# Patient Record
Sex: Female | Born: 1935 | Race: White | Hispanic: No | Smoking: Never smoker
Health system: Southern US, Community
[De-identification: ages and names within clinical notes are randomized; demographics above are authoritative.]

## PROBLEM LIST (undated history)

## (undated) DIAGNOSIS — M069 Rheumatoid arthritis, unspecified: Secondary | ICD-10-CM

## (undated) DIAGNOSIS — I499 Cardiac arrhythmia, unspecified: Secondary | ICD-10-CM

## (undated) DIAGNOSIS — M810 Age-related osteoporosis without current pathological fracture: Secondary | ICD-10-CM

## (undated) HISTORY — PX: OTHER SURGICAL HISTORY: SHX169

## (undated) HISTORY — PX: APPENDECTOMY: SHX54

## (undated) HISTORY — PX: ABDOMINAL HYSTERECTOMY: SHX81

---

## 1998-05-15 ENCOUNTER — Ambulatory Visit: Admission: RE | Admit: 1998-05-15 | Discharge: 1998-05-15 | Payer: Self-pay | Admitting: Endocrinology

## 1998-05-22 ENCOUNTER — Other Ambulatory Visit: Admission: RE | Admit: 1998-05-22 | Discharge: 1998-05-22 | Payer: Self-pay | Admitting: Endocrinology

## 1999-05-30 ENCOUNTER — Other Ambulatory Visit: Admission: RE | Admit: 1999-05-30 | Discharge: 1999-05-30 | Payer: Self-pay | Admitting: Endocrinology

## 1999-07-16 ENCOUNTER — Ambulatory Visit (HOSPITAL_COMMUNITY): Admission: RE | Admit: 1999-07-16 | Discharge: 1999-07-16 | Payer: Self-pay | Admitting: Endocrinology

## 1999-07-16 ENCOUNTER — Encounter: Payer: Self-pay | Admitting: Endocrinology

## 2000-02-19 ENCOUNTER — Ambulatory Visit (HOSPITAL_COMMUNITY): Admission: RE | Admit: 2000-02-19 | Discharge: 2000-02-19 | Payer: Self-pay | Admitting: Endocrinology

## 2000-02-19 ENCOUNTER — Encounter: Payer: Self-pay | Admitting: Endocrinology

## 2000-06-08 ENCOUNTER — Other Ambulatory Visit: Admission: RE | Admit: 2000-06-08 | Discharge: 2000-06-08 | Payer: Self-pay | Admitting: Endocrinology

## 2000-07-20 ENCOUNTER — Ambulatory Visit (HOSPITAL_COMMUNITY): Admission: RE | Admit: 2000-07-20 | Discharge: 2000-07-20 | Payer: Self-pay | Admitting: Endocrinology

## 2000-07-20 ENCOUNTER — Encounter: Payer: Self-pay | Admitting: Endocrinology

## 2001-06-15 ENCOUNTER — Other Ambulatory Visit: Admission: RE | Admit: 2001-06-15 | Discharge: 2001-06-15 | Payer: Self-pay | Admitting: Endocrinology

## 2001-07-22 ENCOUNTER — Encounter: Payer: Self-pay | Admitting: Endocrinology

## 2001-07-22 ENCOUNTER — Ambulatory Visit (HOSPITAL_COMMUNITY): Admission: RE | Admit: 2001-07-22 | Discharge: 2001-07-22 | Payer: Self-pay | Admitting: Endocrinology

## 2002-07-25 ENCOUNTER — Encounter: Payer: Self-pay | Admitting: Endocrinology

## 2002-07-25 ENCOUNTER — Ambulatory Visit (HOSPITAL_COMMUNITY): Admission: RE | Admit: 2002-07-25 | Discharge: 2002-07-25 | Payer: Self-pay | Admitting: Endocrinology

## 2003-05-10 ENCOUNTER — Ambulatory Visit: Admission: RE | Admit: 2003-05-10 | Discharge: 2003-05-10 | Payer: Self-pay | Admitting: Internal Medicine

## 2003-05-10 ENCOUNTER — Encounter (INDEPENDENT_AMBULATORY_CARE_PROVIDER_SITE_OTHER): Payer: Self-pay | Admitting: *Deleted

## 2004-07-26 ENCOUNTER — Encounter (INDEPENDENT_AMBULATORY_CARE_PROVIDER_SITE_OTHER): Payer: Self-pay | Admitting: *Deleted

## 2004-07-26 ENCOUNTER — Ambulatory Visit (HOSPITAL_BASED_OUTPATIENT_CLINIC_OR_DEPARTMENT_OTHER): Admission: RE | Admit: 2004-07-26 | Discharge: 2004-07-26 | Payer: Self-pay | Admitting: General Surgery

## 2004-07-26 ENCOUNTER — Ambulatory Visit (HOSPITAL_COMMUNITY): Admission: RE | Admit: 2004-07-26 | Discharge: 2004-07-26 | Payer: Self-pay | Admitting: General Surgery

## 2006-05-21 ENCOUNTER — Inpatient Hospital Stay (HOSPITAL_COMMUNITY): Admission: EM | Admit: 2006-05-21 | Discharge: 2006-05-23 | Payer: Self-pay | Admitting: Emergency Medicine

## 2006-10-06 ENCOUNTER — Ambulatory Visit (HOSPITAL_COMMUNITY): Admission: RE | Admit: 2006-10-06 | Discharge: 2006-10-06 | Payer: Self-pay | Admitting: *Deleted

## 2007-09-01 ENCOUNTER — Encounter: Admission: RE | Admit: 2007-09-01 | Discharge: 2007-09-01 | Payer: Self-pay | Admitting: Endocrinology

## 2008-10-03 ENCOUNTER — Encounter: Admission: RE | Admit: 2008-10-03 | Discharge: 2008-10-03 | Payer: Self-pay

## 2011-05-09 NOTE — Op Note (Signed)
[name], Mackenzie Flores                    ACCOUNT NO.:  [id number]   MEDICAL RECORD NO.:  [id number]          PATIENT TYPE:  AMB   LOCATION:  ENDO                         FACILITY:  [facility]   PHYSICIAN:  Georgiana Spinner, M.D.    DATE OF BIRTH:  05/29/1927   DATE OF PROCEDURE:  10/06/2006  DATE OF DISCHARGE:                                 OPERATIVE REPORT   PROCEDURE:  Colonoscopy   INDICATIONS:  Colon screening.   ANESTHESIA:  Fentanyl 75 mcg, Versed 7.5 mg.   PROCEDURE:  With the patient mildly sedated in the left lateral decubitus  position, the Olympus videoscopic colonoscope was inserted into the rectum,  passed under direct vision to the cecum identified by ileocecal valve and  appendiceal orifice both of which were photographed.  From this point  colonoscope was slowly withdrawn taking circumferential views of colonic  mucosa stopping only in the rectum which appeared normal on direct and  showed hemorrhoids on retroflexed view.  The endoscope was straightened and  withdrawn.  The patient's vital signs, pulse oximeter remained stable.  The  patient tolerated procedure well without apparent complications.   FINDINGS:  Internal hemorrhoids, otherwise unremarkable examination.           ______________________________  Georgiana Spinner, M.D.     GMO/MEDQ  D:  10/06/2006  T:  10/07/2006  Job:  [id number]

## 2011-05-09 NOTE — H&P (Signed)
Mackenzie Flores, Mackenzie Flores                    ACCOUNT NO.:  [id number]   MEDICAL RECORD NO.:  [id number]          PATIENT TYPE:  INP   LOCATION:  0102                         FACILITY:  [facility]   PHYSICIAN:  Elliot Cousin, M.D.    DATE OF BIRTH:  03-25-1927   DATE OF ADMISSION:  05/21/2006  DATE OF DISCHARGE:                                HISTORY & PHYSICAL   PRIMARY CARE PHYSICIAN:  Brooke Bonito, M.D.   CHIEF COMPLAINT:  Left arm pain and chest tightness.   HISTORY OF PRESENT ILLNESS:  The patient is a 77 year old lady with a past  medical history significant for temporal arteritis, diagnosed in August of  2005,  chronic steroid therapy, history of carotid narrowing, and an  irregular heartbeat, who presented to the emergency department with a chief  complaint of left arm pain that started three to four days ago. It is  described as achy.  The intensity of the pain is rated as moderate.  It is  located primarily in the biceps area and it sometimes radiates down to her  left hand.  It has been intermittent.  The pain is not associated with  numbness or weakness.  She denies any heavy lifting or trauma.  She denies  any increase in activity of the left arm.  She is right-handed.  When she  arrived to the emergency department, she had an episode of chest tightness  which was rated as a 3/10 in intensity.  She was given two sublingual  nitroglycerin tablets and the chest tightness became a 1/10 in intensity.  The chest pain is not associated with nausea, diaphoresis or  lightheadedness.  It is located substernally.  No other associated symptoms.  In the patient's review of systems, she has occasional swelling in her legs,  occasional right jaw pain and a right-sided headache.  She has had no  unilateral leg swelling, fever or chills, shortness of breath, difficulty  swallowing, reflux, nausea, vomiting, or diarrhea, and no recent history of  dysuria.   During the evaluation in the emergency  department, the patient is noted to  be afebrile and hemodynamically stable.  Her initial cardiac markers are  negative.  Her sodium is low at 124 and her potassium is low at 3.1.  Her  EKG reveals an incomplete right bundle branch block and PVCs (question  trigeminy).  Given the patient's risk factors, she will be admitted for  further evaluation and management.   MEDICATIONS:  1.  Aspirin 81 mg daily.  2.  Potassium chloride 20 mEq daily.  3.  Medrol 2 mg every other day.  4.  Pepcid (over-the-counter) 10 mg every other day.  5.  Propranolol 20 mg b.i.d.  6.  Zaroxolyn 5 mg daily.  7.  Belladonna alkaloids question dose b.i.d. for nervous stomach.   ALLERGIES:  The patient has an allergy to CODEINE.   PAST MEDICAL HISTORY:  1.  Right temporal arteritis, diagnosed in August of 2005.  The patient is      treated with chronic steroids.  2.  Irregular  heartbeat.  The patient takes propranolol chronically.  3.  Benign lung mass per bronchoscopy in May of 2004.  4.  History of carotid narrowing.  5.  Chronic bilateral lower extremity edema.  6.  Nervous stomach.  7.  Status post vaginal hysterectomy in the 1970s.  8.  Status post appendectomy in the 1950s.   SOCIAL HISTORY:  The patient is widowed.  [place]  She has one daughter who lives with her.  She has two children in  all.  She is retired from working in [employer].  She quit smoking in 1974.  She denies alcohol and illicit drug use.   FAMILY HISTORY:  Her mother died at 80 years of age secondary to heart  failure.  Her father died of a stroke at 32 years of age.   REVIEW OF SYSTEMS:  As above in the history of present illness.   PHYSICAL EXAMINATION:  GENERAL APPEARANCE:  The patient is a pleasant 77-  year-old Caucasian lady who is currently sitting up in bed in no acute  distress.  VITAL SIGNS:  Temperature afebrile.  Blood pressure 126/50, pulse 65,  respiratory rate 16, oxygen  saturation 97% on room air.  HEENT:  Head is normocephalic and atraumatic.  The right temporal artery is  not palpable.  The left temporal artery is palpable.  Pupils are equal,  round and reactive to light.  Extraocular movements are intact.  Conjunctivae are clear.  Sclerae are white.  Tympanic membranes are clear  bilaterally.  Nasal mucosa is mildly dry.  No sinus tenderness.  Oropharynx  reveals a set of dentures.  Mucous membranes are mildly dry.  No posterior  exudates or erythema.  NECK:  I could not detect carotid bruits.  Her neck is supple.  No  adenopathy, no thyromegaly.  No JVD.  LUNGS:  Clear to auscultation bilaterally.  CHEST WALL:  The patient has only minimal tenderness over the substernal  area.  No associated rash or erythema.  CARDIOVASCULAR:  S1 and S2 with an ectopic beat and 1 to 2/6 systolic  murmur.  BACK:  The patient has mild to moderate thoracic kyphosis.  No tenderness  over the lumbar or thoracic spine.  ABDOMEN:  Positive bowel sounds, soft, nontender and nondistended.  No  hepatosplenomegaly.  No masses palpated.  EXTREMITIES:  The patient has mild arthritic changes in her hands and her  knees bilaterally.  Pedal pulses are 2+ bilaterally.  She has multiple  varicosities of her lower extremities bilaterally.  Trace of pedal edema  bilaterally.  Range of motion of the right and left arm are within normal  limits.  She has mild tenderness over the left rotator cuff and biceps and  triceps areas without any associated edema or erythema.  Hand grip  bilaterally is within normal limits.  Brachial pulses are within normal  limits bilaterally.  Radial pulses are intact bilaterally.  NEUROLOGIC:  The patient is alert and oriented x3.  Cranial nerves II-XII  are intact.  Strength is 5/5 throughout.  Sensation is intact.   ADMISSION LABORATORY DATA:  EKG reveals normal sinus rhythm with PVCs, incomplete right bundle branch block, heart rate 75 beats per  minute.   Myoglobin 107, CK-MB 4, troponin I less than 0.05.  Sodium 124, potassium  3.1, chloride 89, CO2 25, glucose 98, BUN 5, creatinine 0.5, calcium 8.2.   ASSESSMENT:  1.  Left proximal arm pain and substernal chest pain.  The patient's primary      complaint was left arm pain, however, when she arrived to the emergency      department, she had an episode of substernal chest tightness. Her      presentation is somewhat atypical, although, she does have cardiac risk      factors including peripheral vascular disease, age and a cardiac      arrhythmia.  Will also consider musculoskeletal pain,  gastroesophageal      reflux disease,  and anxiety as potential etiologies.   1.  Hyponatremia.  The patient's serum sodium on admission is 124.  The      etiology is unclear.  However, we will consider hyovolemia as the cause,      as she takes Zaroxolyn chronically.  If the patient's serum sodium does      not improve with normal saline, additional sodium indices will need to      be assessed.   1.  Hypokalemia.  The patient's serum potassium 3.1.  More than likely, the      hypokalemia is secondary to [facility] therapy.  Will need to rule out      magnesium deficiency.   1.  Premature ventricular contractions, question trigeminy.  The patient      gives a history of an irregular heartbeat for which she takes      propranolol.  There does not appear to be any evidence of atrial      fibrillation.  Her heart rate is within normal limits.  As stated above,      magnesium deficiency will need to be ruled out.   1.  History of temporal arteritis, diagnosed in August of 2005.  The patient      is chronically treated with steroids.  However, under the circumstances,      the dose will need to be titrated up marginally to cover for stress.   PLAN:  1.  The patient will be admitted for further evaluation and management.  2.  Cardiac enzymes q.8h. x3 will be ordered, to rule out myocardial       infarction. B natriuretic peptide, TSH, fasting lipid panel and      homocystine level will also be assessed.  3.  Will check a chest x-ray PA and lateral to rule out abnormalities.  4.  Will check a magnesium level to rule out magnesium deficiency. Will also      check a baseline CBC and PT/PTT.  5.  Will repeat an EKG in the a.m.  6.  Will consult cardiologist, Dr. Aleen Campi.  7.  Will order bilateral carotid Dopplers to evaluate the extent of carotid      artery stenosis.  8.  Will treat the patient's pain with as needed morphine.  Will also add      nitroglycerin paste one quarter of an inch q.6h. Will start full dose      Lovenox 1 mg/kg subcu q.12h. until the patient completely rules out for      a myocardial infarction.  Will continue an aspirin 81 mg daily.  Will      continue beta-blocker therapy with Lopressor instead of propranolol for      now. 9.  Will replete potassium chloride in the IV fluids and orally.  Will start      volume repletion with normal saline at 100 mL an hour.  10. Will check additional sodium indices if needed.  The patient's serum  sodium and potassium will be reassessed in the morning.  11. Will increase the Medrol to 4 mg daily x2 days and then titrate back      down to 2 mg every other day over the next few days.      Elliot Cousin, M.D.  Electronically Signed     DF/MEDQ  D:  05/21/2006  T:  05/21/2006  Job:  [id number]   cc:   Brooke Bonito, M.D.  Fax: [phone]

## 2011-05-09 NOTE — Op Note (Signed)
NAME:  Mackenzie Flores, Mackenzie Flores                              ACCOUNT NO.:  [id number]   MEDICAL RECORD NO.:  [id number]                   PATIENT TYPE:  AMB   LOCATION:  CARD                                 FACILITY:  [facility]   PHYSICIAN:  Casimiro Needle B. Sherene Sires, M.D. [facility]           DATE OF BIRTH:  [birth year]   DATE OF PROCEDURE:  05/10/2003  DATE OF DISCHARGE:                                 OPERATIVE REPORT   PROCEDURE:  Fiberoptic bronchoscopy with transbronchial biopsy of the right  lower lobe.   INDICATIONS FOR PROCEDURE:  This is a 77 year old white female with recent  onset hemoptysis associated with a dense possible cavitary mass in the right  lower lobe peripherally. Bronchoscopy was felt to be indicated for a tissue  diagnosis and to explore the endobronchial tree to be sure there is not  another source of hemoptysis. For a full discussion please see office notes  for a comprehensive pulmonary consultation which includes a comprehensive  pulmonary consultation performed within the last week. Since that evaluation  was completed there has been no change on history or exam although the  hemoptysis has lessened. The hemoptysis has apparently resolved.   The procedure was performed in the bronchoscopy suite with continuous  monitoring by surface ECG and oximetry. The patient maintained adequate  saturations throughout the procedure on sinus rhythm on nasal prongs. She  received a total of 25 mg of IV Demerol and 2.5 mg of IV Versed for adequate  sedation and cough suppression.   The right naris and oropharynx were liberally anesthetized with 1% lidocaine  by updraft Nebulizer. The right naris was additionally prepared with 2%  lidocaine jelly.   Using a standard flexible fiberoptic bronchoscope, the right naris was  easily cannulated with good visualization of the entire oropharynx and  larynx. The cords moved normally and there were no apparent upper airway  lesions.   Using an additional 1%  lidocaine as needed, the entire tracheobronchial tree  was explored bilaterally with the following findings:   The trachea, carina and all the major airways opened widely on inspiration  to the subsegmental level. There were no focal endobronchial lesions.   DESCRIPTION OF PROCEDURE:  Using a wedge position in several of the anterior  and lateral basal segments of the right lower lobe, the area could be  reached, at least at its inferior aspect, with adequate tissue x4 obtained.  There was minimal bleeding.   I additionally lavaged the right lower lobe for AFB, fungal stain and  culture as well as cytology.    IMPRESSION:  1. Right lower lobe mass undetermined etiology, await the above studies.  2. No evidence of endobronchial source of hemoptysis, therefore, the     hemoptysis most likely is from the mass peripherally.  Charlaine Dalton. Sherene Sires, M.D. [facility]    MBW/MEDQ  D:  05/10/2003  T:  05/10/2003  Job:  [id number]   cc:   Brooke Bonito, M.D.  [address]  [place]  [place] [address]  Fax: [phone]

## 2011-05-09 NOTE — Discharge Summary (Signed)
Mackenzie Flores, Mackenzie Flores                    ACCOUNT NO.:  [id number]   MEDICAL RECORD NO.:  [id number]          PATIENT TYPE:  INP   LOCATION:  1405                         FACILITY:  [facility]   PHYSICIAN:  Michaelyn Barter, M.D. DATE OF BIRTH:  Sep 18, 1927   DATE OF ADMISSION:  05/21/2006  DATE OF DISCHARGE:  05/23/2006                                 DISCHARGE SUMMARY   FINAL DIAGNOSES:  1.  Atypical chest pain.  2.  Left arm pain.  3.  Hyponatremia.  4.  Hypokalemia.  5.  Carotid artery stenosis.   CONSULTATIONS:  Cardiology with Dr. Aleen Campi.   PROCEDURE:  Chest x-ray completed May 21, 2006.   HISTORY OF PRESENT ILLNESS:  Mackenzie Flores is a 77 year old female who presented  to the ER with chief complaint of left arm pain that started 3-4 days prior  to this visit.  The pain was located primarily in her biceps with occasional  radiation down to her left hand.  It is intermittent.  When she arrived to  the ER she also complained of chest tightness rated as 3/10 in intensity.   PAST MEDICAL HISTORY:  Please see that dictated by Dr. Elliot Cousin on May 21, 2006.   HOSPITAL COURSE:  1.  Chest tightness.  The patient had a chest x-ray completed May 31.  The      final impression was no active disease.  Hiatal hernia.  Cardiology was      consulted.  Dr. Aleen Campi was the physician who responded to the      consultation.  He agreed that an adenosine stress test may be indicated      in this patient.  Cardiac enzymes were drawn and her troponin-I was      found to be 0.01, 0.02 and 0.01 respectively.  Her CK-MBs were also      normal x3.  Therefore, she ruled out for an acute cardiac event.  By the      following day, the patient stated that she felt much better.  She denied      having chest pain and likewise stated that her left arm felt much      better.  Dr. Aleen Campi indicated that the patient was stable from a      cardiovascular standpoint, she ruled out for an MI and that she could  follow up with him in his office within 1 week to arrange for an      outpatient Persantine Cardiolite stress test.  By June 2, the patient's      chest pain had completely resolved.  Her arm pain had decreased      significantly.  2.  Left arm pain.  This was very atypical in its description.  Her symptoms      resolved quickly over the course of her hospitalization.  3.  Hyponatremia.  The patient's sodium level was 124 at the time of      admission.  Normal saline was provided to the patient and her sodium      level improved to a  sodium of 137 on June 1, and on the day of discharge      the patient's sodium level was 135.  4.  Hypokalemia.  The patient was noted at the time of her admission to have      had a potassium level of 3.1.  This was treated with supplementation      over the course of her hospitalization and by the date of discharge it      was 3.6.  5.  Carotid artery stenosis.  The patient had cardiovascular Dopplers      completed on May 22, 2006.  The right side showed no internal carotid      artery stenosis.  The left side showed 60% to 80% stenosis.   CONDITION AT THE TIME OF DISCHARGE:  Improved.  On date of discharge, the  patient indicated that her chest pain had resolved.  She denied having any  shortness of breath and also stated that her arm pain had decreased.  Her vitals on the date of discharge showed temperature 97.3, heart rate 66,  respirations 20, blood pressure 116/53.  The decision was made to discharge  the patient home.  She was told to call Dr. Aleen Campi on Monday, May 25, 2006, to schedule a Cardiolite stress test.      Michaelyn Barter, M.D.  Electronically Signed     OR/MEDQ  D:  06/14/2006  T:  06/14/2006  Job:  [id number]

## 2012-08-11 ENCOUNTER — Other Ambulatory Visit (HOSPITAL_COMMUNITY): Payer: Self-pay | Admitting: *Deleted

## 2012-08-12 ENCOUNTER — Ambulatory Visit (HOSPITAL_COMMUNITY)
Admission: RE | Admit: 2012-08-12 | Discharge: 2012-08-12 | Disposition: A | Discharge status: Home / Self Care | Payer: Medicare Other | Source: Ambulatory Visit | Attending: Rheumatology | Admitting: Rheumatology

## 2012-08-12 DIAGNOSIS — M810 Age-related osteoporosis without current pathological fracture: Secondary | ICD-10-CM | POA: Insufficient documentation

## 2012-08-12 MED ORDER — ZOLEDRONIC ACID 5 MG/100ML IV SOLN
5.0000 mg | Freq: Once | INTRAVENOUS | Status: AC
  Administered 2012-08-12: 5 mg via INTRAVENOUS
  Filled 2012-08-12: qty 100

## 2014-09-05 ENCOUNTER — Emergency Department (HOSPITAL_COMMUNITY): Payer: Medicare Other

## 2014-09-05 ENCOUNTER — Emergency Department (HOSPITAL_COMMUNITY)
Admission: EM | Admit: 2014-09-05 | Discharge: 2014-09-05 | Disposition: A | Discharge status: Home / Self Care | Payer: Medicare Other | Attending: Emergency Medicine | Admitting: Emergency Medicine

## 2014-09-05 ENCOUNTER — Encounter (HOSPITAL_COMMUNITY): Payer: Self-pay | Admitting: Emergency Medicine

## 2014-09-05 DIAGNOSIS — Z8679 Personal history of other diseases of the circulatory system: Secondary | ICD-10-CM | POA: Insufficient documentation

## 2014-09-05 DIAGNOSIS — Z8739 Personal history of other diseases of the musculoskeletal system and connective tissue: Secondary | ICD-10-CM | POA: Diagnosis not present

## 2014-09-05 DIAGNOSIS — S79929A Unspecified injury of unspecified thigh, initial encounter: Secondary | ICD-10-CM

## 2014-09-05 DIAGNOSIS — S79919A Unspecified injury of unspecified hip, initial encounter: Secondary | ICD-10-CM | POA: Diagnosis present

## 2014-09-05 DIAGNOSIS — Y9389 Activity, other specified: Secondary | ICD-10-CM | POA: Insufficient documentation

## 2014-09-05 DIAGNOSIS — S7000XA Contusion of unspecified hip, initial encounter: Secondary | ICD-10-CM | POA: Diagnosis not present

## 2014-09-05 DIAGNOSIS — S7002XA Contusion of left hip, initial encounter: Secondary | ICD-10-CM

## 2014-09-05 DIAGNOSIS — Y92009 Unspecified place in unspecified non-institutional (private) residence as the place of occurrence of the external cause: Secondary | ICD-10-CM | POA: Insufficient documentation

## 2014-09-05 DIAGNOSIS — R296 Repeated falls: Secondary | ICD-10-CM | POA: Insufficient documentation

## 2014-09-05 HISTORY — DX: Cardiac arrhythmia, unspecified: I49.9

## 2014-09-05 NOTE — Progress Notes (Signed)
  CARE MANAGEMENT ED NOTE 09/05/2014  Patient:  SARETTA, DAHLEM   Account Number:  [id number]  Date Initiated:  09/05/2014  Documentation initiated by:  Edd Arbour  Subjective/Objective Assessment:   80 yr old [employer] medicare complete Pt was at home carrying sheets when she fell. Pt c/o left hip and groin pain. Pt denies hitting her head or taking in anticoagulants, states that she takes a baby ASA.     Subjective/Objective Assessment Detail:   pcp Michiel Sites     Action/Plan:   SPoke with pt and daughter at bedside updated EPIC   Action/Plan Detail:   Anticipated DC Date:       Status Recommendation to Physician:   Result of Recommendation:    Other ED Services  Consult Working Plan    DC Planning Services  Other  PCP issues  Outpatient Services - Pt will follow up    Choice offered to / List presented to:            Status of service:  Completed, signed off  ED Comments:   ED Comments Detail:

## 2014-09-05 NOTE — ED Notes (Signed)
Pt was at home carrying sheets when she fell. Pt c/o left hip and groin pain. Pt denies hitting her head or taking in anticoagulants, states that she takes a baby ASA.

## 2014-09-05 NOTE — Discharge Instructions (Signed)
Hip Pain Your hip is the joint between your upper legs and your lower pelvis. The bones, cartilage, tendons, and muscles of your hip joint perform a lot of work each day supporting your body weight and allowing you to move around. Hip pain can range from a minor ache to severe pain in one or both of your hips. Pain may be felt on the inside of the hip joint near the groin, or the outside near the buttocks and upper thigh. You may have swelling or stiffness as well.  HOME CARE INSTRUCTIONS   Take medicines only as directed by your health care provider.  Apply ice to the injured area:  Put ice in a plastic bag.  Place a towel between your skin and the bag.  Leave the ice on for 15-20 minutes at a time, 3-4 times a day.  Keep your leg raised (elevated) when possible to lessen swelling.  Avoid activities that cause pain.  Follow specific exercises as directed by your health care provider.  Sleep with a pillow between your legs on your most comfortable side.  Record how often you have hip pain, the location of the pain, and what it feels like. SEEK MEDICAL CARE IF:   You are unable to put weight on your leg.  Your hip is red or swollen or very tender to touch.  Your pain or swelling continues or worsens after 1 week.  You have increasing difficulty walking.  You have a fever. SEEK IMMEDIATE MEDICAL CARE IF:   You have fallen.  You have a sudden increase in pain and swelling in your hip. MAKE SURE YOU:   Understand these instructions.  Will watch your condition.  Will get help right away if you are not doing well or get worse. Document Released: 05/28/2010 Document Revised: 04/24/2014 Document Reviewed: 08/04/2013 ExitCare Patient Information 2015 [employer]. This information is not intended to replace advice given to you by your health care provider. Make sure you discuss any questions you have with your health care provider.  

## 2014-09-05 NOTE — ED Provider Notes (Signed)
CSN: [id number]     Arrival date & time 09/05/14  1325 History   First MD Initiated Contact with Patient 09/05/14 1354     Chief Complaint  Patient presents with  . Fall  . Hip Pain     (Consider location/radiation/quality/duration/timing/severity/associated sxs/prior Treatment) HPI Comments: Patient here after having mechanical fall while she was caring sheets. No head injury. No loss of consciousness. Denies any head or neck or back pain. Complains of pain to her left hip characterized as sharp and worse with walking. Denies any numbness or tingling to her left foot. No chest or abdominal pain. Symptoms persisted no treatment used prior to arrival.  Patient is a 80 y.o. female presenting with fall and hip pain. The history is provided by the patient and a relative.  Fall  Hip Pain    Past Medical History  Diagnosis Date  . Arthritis   . Irregular heartbeat    Past Surgical History  Procedure Laterality Date  . Appendectomy    . Abdominal hysterectomy     No family history on file. History  Substance Use Topics  . Smoking status: Never Smoker   . Smokeless tobacco: Never Used  . Alcohol Use: No   OB History   Grav Para Term Preterm Abortions TAB SAB Ect Mult Living                 Review of Systems  All other systems reviewed and are negative.     Allergies  Codeine  Home Medications   Prior to Admission medications   Not on File   BP 128/58  Pulse 73  Temp(Src) 97.5 F (36.4 C) (Oral)  Resp 18  SpO2 97% Physical Exam  Nursing note and vitals reviewed. Constitutional: She is oriented to person, place, and time. She appears well-developed and well-nourished.  Non-toxic appearance. No distress.  HENT:  Head: Normocephalic and atraumatic.  Eyes: Conjunctivae, EOM and lids are normal. Pupils are equal, round, and reactive to light.  Neck: Normal range of motion. Neck supple. No tracheal deviation present. No mass present.  Cardiovascular: Normal rate,  regular rhythm and normal heart sounds.  Exam reveals no gallop.   No murmur heard. Pulmonary/Chest: Effort normal and breath sounds normal. No stridor. No respiratory distress. She has no decreased breath sounds. She has no wheezes. She has no rhonchi. She has no rales.  Abdominal: Soft. Normal appearance and bowel sounds are normal. She exhibits no distension. There is no tenderness. There is no rebound and no CVA tenderness.  Musculoskeletal: Normal range of motion. She exhibits no edema and no tenderness.       Legs: Neurological: She is alert and oriented to person, place, and time. She has normal strength. No cranial nerve deficit or sensory deficit. GCS eye subscore is 4. GCS verbal subscore is 5. GCS motor subscore is 6.  Skin: Skin is warm and dry. No abrasion and no rash noted.  Psychiatric: She has a normal mood and affect. Her speech is normal and behavior is normal.    ED Course  Procedures (including critical care time) Labs Review Labs Reviewed - No data to display  Imaging Review No results found.   EKG Interpretation None      MDM   Final diagnoses:  None    X-ray of the hip and pelvis are negative for fracture. Patient does use a cane to ambulate normally. Patient has full range of motion at her left hip. No shortening or rotation  noted. Normal straight leg raise. Patient has tramadol at home and will take that. return precautions given    Toy Baker, MD 09/05/14 1510

## 2014-12-23 ENCOUNTER — Encounter (HOSPITAL_COMMUNITY): Payer: Self-pay | Admitting: Emergency Medicine

## 2014-12-23 ENCOUNTER — Inpatient Hospital Stay (HOSPITAL_COMMUNITY)
Admission: EM | Admit: 2014-12-23 | Discharge: 2015-01-01 | DRG: 865 | Disposition: A | Discharge status: Skilled Nursing Facility | Payer: Medicare Other | Attending: Internal Medicine | Admitting: Internal Medicine

## 2014-12-23 ENCOUNTER — Emergency Department (HOSPITAL_COMMUNITY): Payer: Medicare Other

## 2014-12-23 DIAGNOSIS — Z79899 Other long term (current) drug therapy: Secondary | ICD-10-CM

## 2014-12-23 DIAGNOSIS — R778 Other specified abnormalities of plasma proteins: Secondary | ICD-10-CM

## 2014-12-23 DIAGNOSIS — Z681 Body mass index (BMI) 19 or less, adult: Secondary | ICD-10-CM

## 2014-12-23 DIAGNOSIS — M069 Rheumatoid arthritis, unspecified: Secondary | ICD-10-CM

## 2014-12-23 DIAGNOSIS — R509 Fever, unspecified: Secondary | ICD-10-CM | POA: Diagnosis not present

## 2014-12-23 DIAGNOSIS — Z885 Allergy status to narcotic agent status: Secondary | ICD-10-CM | POA: Diagnosis not present

## 2014-12-23 DIAGNOSIS — N61 Inflammatory disorders of breast: Secondary | ICD-10-CM | POA: Diagnosis not present

## 2014-12-23 DIAGNOSIS — E876 Hypokalemia: Secondary | ICD-10-CM | POA: Diagnosis not present

## 2014-12-23 DIAGNOSIS — R4789 Other speech disturbances: Secondary | ICD-10-CM | POA: Diagnosis not present

## 2014-12-23 DIAGNOSIS — I359 Nonrheumatic aortic valve disorder, unspecified: Secondary | ICD-10-CM | POA: Diagnosis not present

## 2014-12-23 DIAGNOSIS — Z66 Do not resuscitate: Secondary | ICD-10-CM | POA: Diagnosis present

## 2014-12-23 DIAGNOSIS — R1310 Dysphagia, unspecified: Secondary | ICD-10-CM | POA: Diagnosis not present

## 2014-12-23 DIAGNOSIS — R7989 Other specified abnormal findings of blood chemistry: Secondary | ICD-10-CM

## 2014-12-23 DIAGNOSIS — L03313 Cellulitis of chest wall: Secondary | ICD-10-CM | POA: Diagnosis not present

## 2014-12-23 DIAGNOSIS — R1312 Dysphagia, oropharyngeal phase: Secondary | ICD-10-CM | POA: Diagnosis not present

## 2014-12-23 DIAGNOSIS — I10 Essential (primary) hypertension: Secondary | ICD-10-CM | POA: Diagnosis present

## 2014-12-23 DIAGNOSIS — Z90710 Acquired absence of both cervix and uterus: Secondary | ICD-10-CM

## 2014-12-23 DIAGNOSIS — L0390 Cellulitis, unspecified: Secondary | ICD-10-CM | POA: Diagnosis present

## 2014-12-23 DIAGNOSIS — L03312 Cellulitis of back [any part except buttock]: Secondary | ICD-10-CM | POA: Diagnosis present

## 2014-12-23 DIAGNOSIS — Z881 Allergy status to other antibiotic agents status: Secondary | ICD-10-CM

## 2014-12-23 DIAGNOSIS — R6889 Other general symptoms and signs: Secondary | ICD-10-CM | POA: Diagnosis not present

## 2014-12-23 DIAGNOSIS — D849 Immunodeficiency, unspecified: Secondary | ICD-10-CM | POA: Diagnosis present

## 2014-12-23 DIAGNOSIS — I248 Other forms of acute ischemic heart disease: Secondary | ICD-10-CM | POA: Diagnosis not present

## 2014-12-23 DIAGNOSIS — M6281 Muscle weakness (generalized): Secondary | ICD-10-CM | POA: Diagnosis not present

## 2014-12-23 DIAGNOSIS — D72829 Elevated white blood cell count, unspecified: Secondary | ICD-10-CM | POA: Diagnosis present

## 2014-12-23 DIAGNOSIS — E43 Unspecified severe protein-calorie malnutrition: Secondary | ICD-10-CM | POA: Diagnosis not present

## 2014-12-23 DIAGNOSIS — Z7982 Long term (current) use of aspirin: Secondary | ICD-10-CM | POA: Diagnosis not present

## 2014-12-23 DIAGNOSIS — R278 Other lack of coordination: Secondary | ICD-10-CM | POA: Diagnosis not present

## 2014-12-23 DIAGNOSIS — B029 Zoster without complications: Secondary | ICD-10-CM | POA: Diagnosis not present

## 2014-12-23 DIAGNOSIS — R2681 Unsteadiness on feet: Secondary | ICD-10-CM | POA: Diagnosis not present

## 2014-12-23 DIAGNOSIS — B028 Zoster with other complications: Principal | ICD-10-CM | POA: Diagnosis present

## 2014-12-23 DIAGNOSIS — E871 Hypo-osmolality and hyponatremia: Secondary | ICD-10-CM | POA: Diagnosis not present

## 2014-12-23 DIAGNOSIS — R531 Weakness: Secondary | ICD-10-CM | POA: Diagnosis not present

## 2014-12-23 DIAGNOSIS — M810 Age-related osteoporosis without current pathological fracture: Secondary | ICD-10-CM | POA: Diagnosis present

## 2014-12-23 DIAGNOSIS — L03818 Cellulitis of other sites: Secondary | ICD-10-CM | POA: Diagnosis not present

## 2014-12-23 DIAGNOSIS — R4702 Dysphasia: Secondary | ICD-10-CM | POA: Diagnosis not present

## 2014-12-23 DIAGNOSIS — R41841 Cognitive communication deficit: Secondary | ICD-10-CM | POA: Diagnosis not present

## 2014-12-23 DIAGNOSIS — D899 Disorder involving the immune mechanism, unspecified: Secondary | ICD-10-CM

## 2014-12-23 DIAGNOSIS — F419 Anxiety disorder, unspecified: Secondary | ICD-10-CM | POA: Diagnosis not present

## 2014-12-23 DIAGNOSIS — I499 Cardiac arrhythmia, unspecified: Secondary | ICD-10-CM | POA: Diagnosis not present

## 2014-12-23 HISTORY — DX: Rheumatoid arthritis, unspecified: M06.9

## 2014-12-23 HISTORY — DX: Age-related osteoporosis without current pathological fracture: M81.0

## 2014-12-23 LAB — COMPREHENSIVE METABOLIC PANEL
ALBUMIN: 3.5 g/dL (ref 3.5–5.2)
ALT: 24 U/L (ref 0–35)
ANION GAP: 14 (ref 5–15)
AST: 67 U/L — ABNORMAL HIGH (ref 0–37)
Alkaline Phosphatase: 93 U/L (ref 39–117)
BUN: 22 mg/dL (ref 6–23)
CALCIUM: 8.3 mg/dL — AB (ref 8.4–10.5)
CO2: 25 mmol/L (ref 19–32)
CREATININE: 0.9 mg/dL (ref 0.50–1.10)
Chloride: 93 mEq/L — ABNORMAL LOW (ref 96–112)
GFR calc Af Amer: 66 mL/min — ABNORMAL LOW (ref >90)
GFR calc non Af Amer: 57 mL/min — ABNORMAL LOW (ref >90)
Glucose, Bld: 117 mg/dL — ABNORMAL HIGH (ref 70–99)
Potassium: 4.4 mmol/L (ref 3.5–5.1)
Sodium: 132 mmol/L — ABNORMAL LOW (ref 135–145)
TOTAL PROTEIN: 6.3 g/dL (ref 6.0–8.3)
Total Bilirubin: 1.9 mg/dL — ABNORMAL HIGH (ref 0.3–1.2)

## 2014-12-23 LAB — CBC WITH DIFFERENTIAL/PLATELET
BASOS ABS: 0.1 10*3/uL (ref 0.0–0.1)
BASOS PCT: 1 % (ref 0–1)
EOS ABS: 0.2 10*3/uL (ref 0.0–0.7)
Eosinophils Relative: 2 % (ref 0–5)
HEMATOCRIT: 45.7 % (ref 36.0–46.0)
HEMOGLOBIN: 15.3 g/dL — AB (ref 12.0–15.0)
Lymphocytes Relative: 6 % — ABNORMAL LOW (ref 12–46)
Lymphs Abs: 0.5 10*3/uL — ABNORMAL LOW (ref 0.7–4.0)
MCH: 31.9 pg (ref 26.0–34.0)
MCHC: 33.5 g/dL (ref 30.0–36.0)
MCV: 95.2 fL (ref 78.0–100.0)
MONO ABS: 0.6 10*3/uL (ref 0.1–1.0)
MONOS PCT: 8 % (ref 3–12)
Neutro Abs: 6.5 10*3/uL (ref 1.7–7.7)
Neutrophils Relative %: 83 % — ABNORMAL HIGH (ref 43–77)
Platelets: 133 10*3/uL — ABNORMAL LOW (ref 150–400)
RBC: 4.8 MIL/uL (ref 3.87–5.11)
RDW: 14.4 % (ref 11.5–15.5)
WBC: 7.9 10*3/uL (ref 4.0–10.5)

## 2014-12-23 LAB — I-STAT CG4 LACTIC ACID, ED: LACTIC ACID, VENOUS: 2.83 mmol/L — AB (ref 0.5–2.2)

## 2014-12-23 LAB — TROPONIN I
TROPONIN I: 0.1 ng/mL — AB (ref <0.031)
Troponin I: 0.23 ng/mL — ABNORMAL HIGH (ref <0.031)

## 2014-12-23 MED ORDER — ACETAMINOPHEN 650 MG RE SUPP: 650.0000 mg | Freq: Four times a day (QID) | RECTAL | Status: DC | PRN

## 2014-12-23 MED ORDER — DEXTROSE 5 % IV SOLN
10.0000 mg/kg | Freq: Two times a day (BID) | INTRAVENOUS | Status: DC
  Filled 2014-12-23: qty 9.5

## 2014-12-23 MED ORDER — OYSTER CALCIUM 500 MG PO TABS
1250.0000 mg | ORAL_TABLET | ORAL | Status: DC
  Administered 2014-12-25 – 2014-12-31 (×3): 1250 mg via ORAL
  Filled 2014-12-23 (×4): qty 1

## 2014-12-23 MED ORDER — ACETAMINOPHEN 325 MG PO TABS: 650.0000 mg | ORAL_TABLET | Freq: Four times a day (QID) | ORAL | Status: DC | PRN

## 2014-12-23 MED ORDER — ENSURE COMPLETE PO LIQD
237.0000 mL | Freq: Two times a day (BID) | ORAL | Status: DC
  Administered 2014-12-26: 237 mL via ORAL

## 2014-12-23 MED ORDER — DEXTROSE 5 % IV SOLN
1.0000 g | Freq: Once | INTRAVENOUS | Status: AC
  Administered 2014-12-23: 1 g via INTRAVENOUS
  Filled 2014-12-23: qty 10

## 2014-12-23 MED ORDER — METHYLPREDNISOLONE 4 MG PO TABS
4.0000 mg | ORAL_TABLET | Freq: Every day | ORAL | Status: DC
  Administered 2014-12-23: 4 mg via ORAL
  Filled 2014-12-23 (×2): qty 1

## 2014-12-23 MED ORDER — VANCOMYCIN HCL IN DEXTROSE 1-5 GM/200ML-% IV SOLN
1000.0000 mg | Freq: Once | INTRAVENOUS | Status: AC
  Administered 2014-12-23: 1000 mg via INTRAVENOUS
  Filled 2014-12-23: qty 200

## 2014-12-23 MED ORDER — ENOXAPARIN SODIUM 40 MG/0.4ML ~~LOC~~ SOLN
40.0000 mg | SUBCUTANEOUS | Status: DC
  Administered 2014-12-23 – 2014-12-31 (×9): 40 mg via SUBCUTANEOUS
  Filled 2014-12-23 (×10): qty 0.4

## 2014-12-23 MED ORDER — PIPERACILLIN-TAZOBACTAM 3.375 G IVPB
3.3750 g | Freq: Three times a day (TID) | INTRAVENOUS | Status: DC
  Administered 2014-12-23 – 2014-12-29 (×17): 3.375 g via INTRAVENOUS
  Filled 2014-12-23 (×18): qty 50

## 2014-12-23 MED ORDER — ALUM & MAG HYDROXIDE-SIMETH 200-200-20 MG/5ML PO SUSP: 30.0000 mL | Freq: Four times a day (QID) | ORAL | Status: DC | PRN

## 2014-12-23 MED ORDER — PROPRANOLOL HCL 20 MG PO TABS
20.0000 mg | ORAL_TABLET | Freq: Two times a day (BID) | ORAL | Status: DC
  Administered 2014-12-23 – 2015-01-01 (×18): 20 mg via ORAL
  Filled 2014-12-23 (×20): qty 1

## 2014-12-23 MED ORDER — SODIUM CHLORIDE 0.9 % IV BOLUS (SEPSIS)
1000.0000 mL | Freq: Once | INTRAVENOUS | Status: AC
  Administered 2014-12-23: 1000 mL via INTRAVENOUS

## 2014-12-23 MED ORDER — VITAMIN B-12 100 MCG PO TABS
100.0000 ug | ORAL_TABLET | Freq: Every day | ORAL | Status: DC
  Administered 2014-12-24 – 2015-01-01 (×9): 100 ug via ORAL
  Filled 2014-12-23 (×10): qty 1

## 2014-12-23 MED ORDER — MORPHINE SULFATE 4 MG/ML IJ SOLN: 4.0000 mg | INTRAMUSCULAR | Status: DC | PRN

## 2014-12-23 MED ORDER — SODIUM CHLORIDE 0.9 % IV SOLN
INTRAVENOUS | Status: DC
  Administered 2014-12-23 – 2014-12-31 (×8): via INTRAVENOUS

## 2014-12-23 MED ORDER — DEXTROSE 5 % IV SOLN
10.0000 mg/kg | Freq: Two times a day (BID) | INTRAVENOUS | Status: DC
  Administered 2014-12-24 (×2): 475 mg via INTRAVENOUS
  Filled 2014-12-23 (×2): qty 9.5

## 2014-12-23 MED ORDER — TRAMADOL HCL 50 MG PO TABS
50.0000 mg | ORAL_TABLET | Freq: Four times a day (QID) | ORAL | Status: DC | PRN
  Administered 2014-12-23 – 2015-01-01 (×11): 50 mg via ORAL
  Filled 2014-12-23 (×11): qty 1

## 2014-12-23 MED ORDER — FOLIC ACID 1 MG PO TABS
1.0000 mg | ORAL_TABLET | Freq: Every day | ORAL | Status: DC
  Administered 2014-12-24 – 2015-01-01 (×9): 1 mg via ORAL
  Filled 2014-12-23 (×9): qty 1

## 2014-12-23 MED ORDER — VANCOMYCIN HCL IN DEXTROSE 750-5 MG/150ML-% IV SOLN
750.0000 mg | INTRAVENOUS | Status: DC
  Administered 2014-12-24 – 2014-12-25 (×2): 750 mg via INTRAVENOUS
  Filled 2014-12-23 (×3): qty 150

## 2014-12-23 MED ORDER — CETYLPYRIDINIUM CHLORIDE 0.05 % MT LIQD
7.0000 mL | Freq: Two times a day (BID) | OROMUCOSAL | Status: DC
  Administered 2014-12-24 – 2015-01-01 (×17): 7 mL via OROMUCOSAL

## 2014-12-23 MED ORDER — VALACYCLOVIR HCL 500 MG PO TABS
1000.0000 mg | ORAL_TABLET | Freq: Once | ORAL | Status: AC
  Administered 2014-12-23: 1000 mg via ORAL
  Filled 2014-12-23: qty 2

## 2014-12-23 MED ORDER — POLYETHYLENE GLYCOL 3350 17 G PO PACK
17.0000 g | PACK | Freq: Every day | ORAL | Status: DC | PRN
  Administered 2014-12-25: 17 g via ORAL
  Filled 2014-12-23 (×2): qty 1

## 2014-12-23 MED ORDER — DIAZEPAM 5 MG PO TABS
5.0000 mg | ORAL_TABLET | Freq: Three times a day (TID) | ORAL | Status: DC
  Administered 2014-12-23 – 2014-12-24 (×2): 5 mg via ORAL
  Filled 2014-12-23 (×2): qty 1

## 2014-12-23 MED ORDER — ASPIRIN EC 81 MG PO TBEC
81.0000 mg | DELAYED_RELEASE_TABLET | Freq: Every day | ORAL | Status: DC
  Administered 2014-12-24 – 2015-01-01 (×9): 81 mg via ORAL
  Filled 2014-12-23 (×9): qty 1

## 2014-12-23 NOTE — H&P (Addendum)
History and Physical:    Mackenzie Flores IEP:[id number] DOB: 02-Jan-1927 DOA: 12/23/2014  Referring physician: Dr. Patria Flores PCP: Mackenzie Sites, MD   Chief Complaint: Fever, generalized weakness, rash  History of Present Illness:   Mackenzie Flores is an 81 y.o. female with a PMH of OA/irregular heartbeat who presented to the hospital with a chief complaint of rash, fever, generalized weakness.  Her daughter brought her to the ER for evaluation when she couldn't get her out of bed this morning.  She also noticed she was "breathing faster" and that she had trouble moving her right arm and right leg.  The patient's daughter was not aware that she was having fevers, but noticed that her gown and house coat were wet.  Appetite has been diminished for the past 4 days, and she has had nausea with one episode of vomiting a few days ago.  She also recently had a cough productive of green mucous.  The daughter hadn't noticed the rash, but the patient tells me she has had back pain for awhile, cannot characterize how long.  ROS:   Constitutional: No fever, no chills;  Appetite diminished; No weight loss, no weight gain, + fatigue.  HEENT: No blurry vision, no diplopia, no pharyngitis, no dysphagia CV: No chest pain, no palpitations, no PND, no orthopnea, no edema.  Resp: + SOB, no cough, no pleuritic pain. GI: + nausea, + vomiting, no diarrhea, no melena, no hematochezia, no constipation, no abdominal pain.  GU: No dysuria, no hematuria, no frequency, no urgency. MSK: no myalgias, + chronic arthralgias.  Neuro:  No headache, no focal neurological deficits, no history of seizures.  Psych: No depression, no anxiety.  Endo: No heat intolerance, no cold intolerance, no polyuria, no polydipsia  Skin: + rashes, no skin lesions.  Heme: No easy bruising.  Travel history: No recent travel.   Past Medical History:   Past Medical History  Diagnosis Date  . Rheumatoid arthritis   . Irregular heartbeat   . Osteoporosis       Past Surgical History:   Past Surgical History  Procedure Laterality Date  . Appendectomy    . Abdominal hysterectomy    . Cataract surgery      Social History:   History   Social History  . Marital Status: Widowed    Spouse Name: N/A    Number of Children: 2  . Years of Education: N/A   Occupational History  . Not on file.   Social History Main Topics  . Smoking status: Never Smoker   . Smokeless tobacco: Never Used  . Alcohol Use: No  . Drug Use: Not on file  . Sexual Activity: Not on file   Other Topics Concern  . Not on file   Social History Narrative   Widowed.  Lives with daughter, Mackenzie Flores. Ambulates with a cane or walker.    Family history:   Family History  Problem Relation Age of Onset  . Cancer Sister     Brain cancer  . Cancer Brother     Lung cancer  . Stroke Father   . Heart attack Mother     Allergies   Codeine and Clarithromycin  Current Medications:   Prior to Admission medications   Medication Sig Start Date End Date Taking? Authorizing Provider  aspirin EC 81 MG tablet Take 81 mg by mouth daily.   Yes Historical Provider, MD  calcium carbonate (OS-CAL) 600 MG TABS tablet Take 600 mg by mouth  every other day.   Yes Historical Provider, MD  Cyanocobalamin (VITAMIN B 12 PO) Take 1 tablet by mouth daily.   Yes Historical Provider, MD  diazepam (VALIUM) 5 MG tablet Take 5 mg by mouth 3 (three) times daily.    Yes Historical Provider, MD  folic acid (FOLVITE) 1 MG tablet Take 1 mg by mouth daily.   Yes Historical Provider, MD  furosemide (LASIX) 40 MG tablet Take 40 mg by mouth 2 (two) times daily.   Yes Historical Provider, MD  hydroxychloroquine (PLAQUENIL) 200 MG tablet Take 200 mg by mouth at bedtime.    Yes Historical Provider, MD  methylPREDNISolone (MEDROL) 4 MG tablet Take 4 mg by mouth at bedtime.    Yes Historical Provider, MD  potassium chloride (K-DUR,KLOR-CON) 10 MEQ tablet Take 20 mEq by mouth daily.   Yes Historical  Provider, MD  propranolol (INDERAL) 20 MG tablet Take 20 mg by mouth 2 (two) times daily.   Yes Historical Provider, MD  traMADol (ULTRAM) 50 MG tablet Take 50 mg by mouth every 6 (six) hours as needed for moderate pain.    Yes Historical Provider, MD  Triamcinolone Acetonide (KENALOG IJ) Inject 1 each as directed once a week. Got in right knee at dr's office--Wed   Yes Historical Provider, MD    Physical Exam:   Filed Vitals:   12/23/14 1655 12/23/14 1733 12/23/14 1800 12/23/14 1815  BP:  153/61 147/52   Pulse: 80 77    Temp:      TempSrc:      Resp: 20 11 27 24   SpO2: 97% 90% 99% 100%     Physical Exam: Blood pressure 147/52, pulse 77, temperature 101.4 F (38.6 C), temperature source Rectal, resp. rate 24, SpO2 100 %. Gen: No acute distress. Head: Normocephalic, atraumatic. Eyes: PERRL, EOMI, sclerae nonicteric. Mouth: Oropharynx with dry mucous membranes, denture to upper palate, poor dentition lower jaw. Neck: Supple, no thyromegaly, no lymphadenopathy, no jugular venous distention. Chest: Lungs diminished. CV: Heart sounds are regular.  No M/R/G. Abdomen: Soft, nontender, nondistended with normal active bowel sounds. Extremities: Extremities are without C/E/C.  Hands with joint deformities. Skin: Warm and dry.  Ecchymosis scattered.  Blood blisters, rash in T4 dermatome.  See image below. Neuro: Alert and oriented times2; cranial nerves II through XII grossly intact. Psych: Mood and affect normal.       Data Review:    Labs: Basic Metabolic Panel:  Recent Labs Lab 12/23/14 1553  NA 132*  K 4.4  CL 93*  CO2 25  GLUCOSE 117*  BUN 22  CREATININE 0.90  CALCIUM 8.3*   Liver Function Tests:  Recent Labs Lab 12/23/14 1553  AST 67*  ALT 24  ALKPHOS 93  BILITOT 1.9*  PROT 6.3  ALBUMIN 3.5   CBC:  Recent Labs Lab 12/23/14 1553  WBC 7.9  NEUTROABS 6.5  HGB 15.3*  HCT 45.7  MCV 95.2  PLT 133*   Cardiac Enzymes:  Recent Labs Lab  12/23/14 1553  TROPONINI 0.10*    Radiographic Studies: Dg Chest 2 View  12/23/2014   CLINICAL DATA:  Fever, weakness.  EXAM: CHEST  2 VIEW  COMPARISON:  September 09, 2010.  FINDINGS: The heart size and mediastinal contours are within normal limits. Both lungs are clear. Stable hiatal hernia. No pneumothorax or pleural effusion is noted. Mild thoracic kyphosis is noted.  IMPRESSION: No acute cardiopulmonary abnormality seen.   Electronically Signed   By: Marry Guan.D.  On: 12/23/2014 16:47    EKG: Independently reviewed. Sinus rhythm; Atrial premature complexes; Borderline low voltage, extremity leads; RSR' in V1 or V2, right VCD or RVH; Borderline prolonged QT interval   Assessment/Plan:   Principal Problem:   Cellulitis, severe, in immunosuppressed host with a shingles outbreak  Hold Plaquenil but continue steroids as risk of adrenal crisis high given her infection.  If BP drops, will need stress dose steroids, but currently hemodynamically stable.  Will treat with broad spectrum antibiotics given immunosuppression and severity of cellulitis.  Acyclovir to treat shingles outbreak.  Blood cultures x 2.  Active Problems:   Rheumatoid arthritis  Hold Plaquanil.  Continue steroids for now.    Hyponatremia  Hold lasix and gently hydrate.    Elevated troponin I level  No chest pain or acute ischemic changes on EKG.  Suspect demand ischemia.  Cycle troponins.  Continue ASA, Inderal.  DVT prophylaxis  Lovenox ordered.  Code Status: DNR, discussed with patient. Family Communication: Daughter Skeet Simmer [phone]) Disposition Plan: Home when stable.  Time spent: 1 hour.  RAMA,CHRISTINA [facility] Pager [phone] Cell: [phone]   If 7PM-7AM, please contact night-coverage [email or web address] Password [facility] 12/23/2014, 6:17 PM

## 2014-12-23 NOTE — ED Notes (Signed)
Informed lab that we could not get blood cultures.  Stated that they would be up after 5:30pm.

## 2014-12-23 NOTE — ED Notes (Signed)
Pt reports generalized weakness starting 0600 this morning. Pt denies n/v/d, CP, SOB, or numbness.

## 2014-12-23 NOTE — ED Provider Notes (Signed)
CSN: [id number]     Arrival date & time 12/23/14  1524 History   First MD Initiated Contact with Patient 12/23/14 1534     Chief Complaint  Patient presents with  . Weakness      HPI Family reports generalized weakness over the past 4-5 days with mild decreased oral intake. No n/v/d. Found to have fever on arrival to ER. No urinary complaints, cough or SOB.  Reports itch to right breast. No abdominal pain. Family reports no AMS. Zoster rash in right T4 dermatomal distribution   Past Medical History  Diagnosis Date  . Arthritis   . Irregular heartbeat    Past Surgical History  Procedure Laterality Date  . Appendectomy    . Abdominal hysterectomy     No family history on file. History  Substance Use Topics  . Smoking status: Never Smoker   . Smokeless tobacco: Never Used  . Alcohol Use: No   OB History    No data available     Review of Systems  All other systems reviewed and are negative.     Allergies  Codeine and Clarithromycin  Home Medications   Prior to Admission medications   Medication Sig Start Date End Date Taking? Authorizing Provider  aspirin EC 81 MG tablet Take 81 mg by mouth daily.   Yes Historical Provider, MD  calcium carbonate (OS-CAL) 600 MG TABS tablet Take 600 mg by mouth every other day.   Yes Historical Provider, MD  Cyanocobalamin (VITAMIN B 12 PO) Take 1 tablet by mouth daily.   Yes Historical Provider, MD  diazepam (VALIUM) 5 MG tablet Take 5 mg by mouth 3 (three) times daily.    Yes Historical Provider, MD  folic acid (FOLVITE) 1 MG tablet Take 1 mg by mouth daily.   Yes Historical Provider, MD  furosemide (LASIX) 40 MG tablet Take 40 mg by mouth 2 (two) times daily.   Yes Historical Provider, MD  hydroxychloroquine (PLAQUENIL) 200 MG tablet Take 200 mg by mouth at bedtime.    Yes Historical Provider, MD  methylPREDNISolone (MEDROL) 4 MG tablet Take 4 mg by mouth at bedtime.    Yes Historical Provider, MD  potassium chloride  (K-DUR,KLOR-CON) 10 MEQ tablet Take 20 mEq by mouth daily.   Yes Historical Provider, MD  propranolol (INDERAL) 20 MG tablet Take 20 mg by mouth 2 (two) times daily.   Yes Historical Provider, MD  traMADol (ULTRAM) 50 MG tablet Take 50 mg by mouth every 6 (six) hours as needed for moderate pain.    Yes Historical Provider, MD  Triamcinolone Acetonide (KENALOG IJ) Inject 1 each as directed once a week. Got in right knee at dr's office--Wed   Yes Historical Provider, MD   BP 130/67 mmHg  Pulse 84  Temp(Src) 101.4 F (38.6 C) (Rectal)  Resp 20  SpO2 100% Physical Exam  Constitutional: She is oriented to person, place, and time. She appears well-developed and well-nourished. No distress.  HENT:  Head: Normocephalic and atraumatic.  Eyes: EOM are normal.  Neck: Normal range of motion.  Cardiovascular: Normal rate, regular rhythm and normal heart sounds.   Pulmonary/Chest: Effort normal and breath sounds normal.  Abdominal: Soft. She exhibits no distension. There is no tenderness.  Musculoskeletal: Normal range of motion.  Zoster in right t4 dermatome with associated cellulitis coming around to right chest and right breast.   Neurological: She is alert and oriented to person, place, and time.  Skin: Skin is warm and dry.  Psychiatric: She has a normal mood and affect. Judgment normal.  Nursing note and vitals reviewed.   ED Course  Procedures (including critical care time) Labs Review Labs Reviewed  CBC WITH DIFFERENTIAL - Abnormal; Notable for the following:    Hemoglobin 15.3 (*)    Platelets 133 (*)    Neutrophils Relative % 83 (*)    Lymphocytes Relative 6 (*)    Lymphs Abs 0.5 (*)    All other components within normal limits  I-STAT CG4 LACTIC ACID, ED - Abnormal; Notable for the following:    Lactic Acid, Venous 2.83 (*)    All other components within normal limits  CULTURE, BLOOD (ROUTINE X 2)  CULTURE, BLOOD (ROUTINE X 2)  URINE CULTURE  COMPREHENSIVE METABOLIC PANEL   TROPONIN I  URINALYSIS, ROUTINE W REFLEX MICROSCOPIC    Imaging Review No results found.   EKG Interpretation   Date/Time:  Saturday December 23 2014 15:38:54 EST Ventricular Rate:  85 PR Interval:  137 QRS Duration: 100 QT Interval:  408 QTC Calculation: 485 R Axis:     Text Interpretation:  Sinus rhythm Atrial premature complexes Borderline  low voltage, extremity leads RSR' in V1 or V2, right VCD or RVH Borderline  prolonged QT interval No significant change was found Confirmed by Jaquae Rieves   MD, Burnette Valenti ([id number]) on 12/23/2014 4:34:53 PM      MDM   Final diagnoses:  Fever  Weakness    Zoster with cellulitis. IV abx now. Will place on valacyclovir as well. Admit, given fever and generalized weakness    Lyanne Co, MD 12/23/14 [id number]

## 2014-12-23 NOTE — ED Notes (Signed)
Unsuccessful stick by this writer times two. RN made aware.

## 2014-12-23 NOTE — ED Notes (Addendum)
Per Darl Pikes will administer remaining medications PRIOR to transport. Bernette Redbird aware and also made aware lab has not come for 2nd culture.

## 2014-12-23 NOTE — ED Notes (Signed)
Bed: WA12 Expected date:  Expected time:  Means of arrival:  Comments: weakness 

## 2014-12-23 NOTE — Progress Notes (Signed)
ANTIBIOTIC CONSULT NOTE - INITIAL  Pharmacy Consult for Zosyn, Vancomycin, Acyclovir Indication: Cellulitis, R chest/back zoster  Allergies  Allergen Reactions  . Codeine Nausea And Vomiting  . Clarithromycin Nausea And Vomiting and Hypertension    Patient Measurements: Height: 5\' 1"  (154.9 cm) Weight: 104 lb 8 oz (47.4 kg) IBW/kg (Calculated) : 47.8  Vital Signs: Temp: 101.4 F (38.6 C) (01/02 1539) Temp Source: Rectal (01/02 1539) BP: 192/84 mmHg (01/02 1900) Pulse Rate: 77 (01/02 1733) Intake/Output from previous day:   Intake/Output from this shift:    Labs:  Recent Labs  12/23/14 1553  WBC 7.9  HGB 15.3*  PLT 133*  CREATININE 0.90   Estimated Creatinine Clearance: 34.2 mL/min (by C-G formula based on Cr of 0.9). No results for input(s): VANCOTROUGH, VANCOPEAK, VANCORANDOM, GENTTROUGH, GENTPEAK, GENTRANDOM, TOBRATROUGH, TOBRAPEAK, TOBRARND, AMIKACINPEAK, AMIKACINTROU, AMIKACIN in the last 72 hours.   Microbiology: No results found for this or any previous visit (from the past 720 hour(s)).  Medical History: Past Medical History  Diagnosis Date  . Rheumatoid arthritis   . Irregular heartbeat   . Osteoporosis    Assessment: 81 y/o F with PMH of RA, irregular heartbeat, osteoporosis who presents 1/2 with rash, fever, generalized weakness.  Patient has had poor appetite, nausea, one episode of vomiting a few days ago, and recent productive cough.  Pharmacy consulted to assist with dosing of Vancomycin and Zosyn for severe cellulitis, as well as Acyclovir for shingles.  1/2 >> Ceftriaxone x 1 1/2 >> Valtrex x 1 1/2 >> Vancomycin >> 1/2 >> Zosyn >> 1/2 >> Acyclovir >>    Tmax: 101.4 F WBCs: WNL, 7.9 Renal: SCr 0.9, CrCl ~ 34 mL/min CG  1/2 blood x 2: sent 1/2 urine: ordered   Goal of Therapy:  Vancomycin trough level 10-15 mcg/ml  Appropriate antibiotic dosing for renal function and indication Eradication of infection  Plan:  Zosyn 3.375g IV q8h  (infuse over 4 hours). Vancomycin 750 mg IV q24h to start on 12/24/14 (received 1 gram in ED today @ 1853). Plan for Vancomycin trough level at steady state. Acyclovir 10 mg/kg (475 mg) IV q12h. Monitor renal function, cultures, clinical course.   02/22/15, PharmD, BCPS Pager: [phone] 12/23/2014 8:47 PM

## 2014-12-23 NOTE — ED Notes (Signed)
Was not able to draw culture.   Pt's veins blew.

## 2014-12-23 NOTE — ED Notes (Signed)
[name] at bedside to attempt 2nd culture.

## 2014-12-23 NOTE — ED Notes (Signed)
Per EMS, pt from home.  C/O weakness x 1 week.  No pain.  Has arthritis and slow movements anyway.  However, feels increased this weak.  Pt feeling chills.  Pt hypothermic by [employer].  96 temp.  Alert and oriented x 4.  No n/v.  EKG wnl.  No shortness of breath.  Denies pain.  No diarrhea.  Not eating as well as normal per daughter.  Vitals:  124/64, hr 85, 93% ra, resp 12, cbg 117

## 2014-12-23 NOTE — ED Notes (Addendum)
Pt has linear rash extending from right breast to back. Pt reports had pain and itching at site 4 days ago.

## 2014-12-24 DIAGNOSIS — E876 Hypokalemia: Secondary | ICD-10-CM | POA: Diagnosis not present

## 2014-12-24 LAB — URINALYSIS, ROUTINE W REFLEX MICROSCOPIC
BILIRUBIN URINE: NEGATIVE
Glucose, UA: NEGATIVE mg/dL
Hgb urine dipstick: NEGATIVE
Ketones, ur: NEGATIVE mg/dL
LEUKOCYTES UA: NEGATIVE
NITRITE: NEGATIVE
Protein, ur: NEGATIVE mg/dL
SPECIFIC GRAVITY, URINE: 1.011 (ref 1.005–1.030)
UROBILINOGEN UA: 1 mg/dL (ref 0.0–1.0)
pH: 6 (ref 5.0–8.0)

## 2014-12-24 LAB — CBC
HEMATOCRIT: 40.8 % (ref 36.0–46.0)
HEMOGLOBIN: 13.4 g/dL (ref 12.0–15.0)
MCH: 31.2 pg (ref 26.0–34.0)
MCHC: 32.8 g/dL (ref 30.0–36.0)
MCV: 94.9 fL (ref 78.0–100.0)
PLATELETS: 114 10*3/uL — AB (ref 150–400)
RBC: 4.3 MIL/uL (ref 3.87–5.11)
RDW: 14.4 % (ref 11.5–15.5)
WBC: 7.9 10*3/uL (ref 4.0–10.5)

## 2014-12-24 LAB — BASIC METABOLIC PANEL
Anion gap: 10 (ref 5–15)
BUN: 19 mg/dL (ref 6–23)
CALCIUM: 7.4 mg/dL — AB (ref 8.4–10.5)
CO2: 22 mmol/L (ref 19–32)
CREATININE: 0.75 mg/dL (ref 0.50–1.10)
Chloride: 94 mEq/L — ABNORMAL LOW (ref 96–112)
GFR calc non Af Amer: 75 mL/min — ABNORMAL LOW (ref >90)
GFR, EST AFRICAN AMERICAN: 87 mL/min — AB (ref >90)
Glucose, Bld: 121 mg/dL — ABNORMAL HIGH (ref 70–99)
Potassium: 2.7 mmol/L — CL (ref 3.5–5.1)
Sodium: 126 mmol/L — ABNORMAL LOW (ref 135–145)

## 2014-12-24 LAB — MAGNESIUM: Magnesium: 1.5 mg/dL (ref 1.5–2.5)

## 2014-12-24 LAB — TROPONIN I: TROPONIN I: 0.15 ng/mL — AB (ref <0.031)

## 2014-12-24 MED ORDER — POTASSIUM CHLORIDE 10 MEQ/100ML IV SOLN
10.0000 meq | INTRAVENOUS | Status: AC
  Administered 2014-12-24 (×5): 10 meq via INTRAVENOUS
  Filled 2014-12-24 (×5): qty 100

## 2014-12-24 MED ORDER — SODIUM CHLORIDE 0.9 % IV BOLUS (SEPSIS)
500.0000 mL | Freq: Once | INTRAVENOUS | Status: AC
  Administered 2014-12-24: 500 mL via INTRAVENOUS

## 2014-12-24 MED ORDER — DEXTROSE 5 % IV SOLN
450.0000 mg | Freq: Three times a day (TID) | INTRAVENOUS | Status: AC
  Administered 2014-12-24 – 2014-12-31 (×22): 450 mg via INTRAVENOUS
  Filled 2014-12-24 (×23): qty 9

## 2014-12-24 MED ORDER — POTASSIUM CHLORIDE CRYS ER 20 MEQ PO TBCR
30.0000 meq | EXTENDED_RELEASE_TABLET | ORAL | Status: AC
  Administered 2014-12-24 (×2): 30 meq via ORAL
  Filled 2014-12-24 (×3): qty 1

## 2014-12-24 MED ORDER — DIAZEPAM 5 MG PO TABS
5.0000 mg | ORAL_TABLET | Freq: Every evening | ORAL | Status: DC | PRN
Start: 2014-12-24 — End: 2015-01-01
  Administered 2014-12-24: 5 mg via ORAL
  Filled 2014-12-24: qty 1

## 2014-12-24 MED ORDER — HYDROCORTISONE NA SUCCINATE PF 100 MG IJ SOLR
50.0000 mg | Freq: Four times a day (QID) | INTRAMUSCULAR | Status: DC
  Administered 2014-12-24 – 2014-12-28 (×15): 50 mg via INTRAVENOUS
  Filled 2014-12-24 (×19): qty 1

## 2014-12-24 NOTE — Progress Notes (Signed)
Dr. Darnelle Catalan notified of patients's b/p of 86/52 which is decrease over the last few hours. Orders received.

## 2014-12-24 NOTE — Progress Notes (Signed)
CRITICAL VALUE ALERT  Critical value received:  K+ 2.7  Date of notification:  12/24/14  Time of notification:  0545  Critical value read back:Yes.    Nurse who received alert:  Hedda Slade RN  MD notified (1st page):  Hospitalist  Time of first page:  0615  MD notified (2nd page):  Time of second page:  Responding MD:  Hospitalist  Time MD responded:  [phone]

## 2014-12-24 NOTE — Progress Notes (Signed)
Progress Note   Mackenzie Flores AST:[id number] DOB: 05-06-1927 DOA: 12/23/2014 PCP: Michiel Sites, MD   Brief Narrative:   Mackenzie Flores is an 81 y.o. female  with a PMH of RA/irregular heartbeat who presented to the hospital with a chief complaint of rash, fever, generalized weakness, admitted with cellulitis in the setting of shingles eruption. Upon initial evaluation, the patient was also noted to be mildly hyponatremic with a mild elevation of troponin level.  Assessment/Plan:   Principal Problem:  Cellulitis, severe, in immunosuppressed host with a shingles outbreak  Hold Plaquenil but continue steroids as risk of adrenal crisis high given her infection. If BP drops, will need stress dose steroids, but currently hemodynamically stable.  Continue vancomycin/Zosyn.  Continue acyclovir.  Follow-up blood cultures x 2.  Active Problems:   Hypokalemia  Repleting. Check magnesium.   Rheumatoid arthritis  Continue to hold Plaquanil.  Continue steroids for now.   Hyponatremia  Sodium level slightly worse today.   Elevated troponin I level  No chest pain or acute ischemic changes on EKG.  Suspect demand ischemia.  Troponin reached a high of 0.23 before beginning to trend down.  Continue ASA, Inderal.  DVT prophylaxis  Lovenox ordered.  Code Status: DNR, discussed with patient. Family Communication: Daughter Mackenzie Flores [phone]) updated yesterday.  No family present today. Disposition Plan: Home when stable.   IV Access:    Peripheral IV   Procedures and diagnostic studies:   Dg Chest 2 View 12/23/2014: No acute cardiopulmonary abnormality seen.      Medical Consultants:    None.  Anti-Infectives:    Rocephin 12/23/14---> 12/23/14  Vancomycin 12/23/14--->  Zosyn 12/23/14--->  Acyclovir 12/23/14--->  Subjective:   Mackenzie Flores seems weak.  Appetite is poor.  She has had trouble voiding.  No dyspnea or N/V.  Still has pain in the area of the  shingles outbreak.  Objective:    Filed Vitals:   12/23/14 1900 12/23/14 2014 12/24/14 0215 12/24/14 0620  BP: 192/84 140/82 103/39 112/42  Pulse:  78 68 74  Temp:  98.3 F (36.8 C) 98.6 F (37 C) 98.9 F (37.2 C)  TempSrc:  Oral Axillary Oral  Resp: 26 24 14 14   Height:  5\' 1"  (1.549 m)    Weight:  47.4 kg (104 lb 8 oz)  46 kg (101 lb 6.6 oz)  SpO2: 100% 94% 95% 97%    Intake/Output Summary (Last 24 hours) at 12/24/14 1025 Last data filed at 12/24/14 0639  Gross per 24 hour  Intake 766.67 ml  Output    800 ml  Net -33.33 ml    Exam: Gen:  NAD, weak. Cardiovascular:  RRR, No M/R/G Respiratory:  Lungs diminished Gastrointestinal:  Abdomen soft, NT/ND, + BS Extremities:  No C/E/C Skin: No significant change to shingles eruption right breast/flank.  Blood blisters on back.   Data Reviewed:    Labs: Basic Metabolic Panel:  Recent Labs Lab 12/23/14 1553 12/24/14 0416  NA 132* 126*  K 4.4 2.7*  CL 93* 94*  CO2 25 22  GLUCOSE 117* 121*  BUN 22 19  CREATININE 0.90 0.75  CALCIUM 8.3* 7.4*  MG  --  1.5   GFR Estimated Creatinine Clearance: 37.3 mL/min (by C-G formula based on Cr of 0.75). Liver Function Tests:  Recent Labs Lab 12/23/14 1553  AST 67*  ALT 24  ALKPHOS 93  BILITOT 1.9*  PROT 6.3  ALBUMIN 3.5   CBC:  Recent Labs  Lab 12/23/14 1553 12/24/14 0416  WBC 7.9 7.9  NEUTROABS 6.5  --   HGB 15.3* 13.4  HCT 45.7 40.8  MCV 95.2 94.9  PLT 133* 114*   Cardiac Enzymes:  Recent Labs Lab 12/23/14 1553 12/23/14 2200 12/24/14 0416  TROPONINI 0.10* 0.23* 0.15*   Sepsis Labs:  Recent Labs Lab 12/23/14 1553 12/23/14 1609 12/24/14 0416  WBC 7.9  --  7.9  LATICACIDVEN  --  2.83*  --    Microbiology No results found for this or any previous visit (from the past 240 hour(s)).   Medications:   . acyclovir  10 mg/kg Intravenous Q12H  . antiseptic oral rinse  7 mL Mouth Rinse BID  . aspirin EC  81 mg Oral Daily  . diazepam  5 mg  Oral TID  . enoxaparin (LOVENOX) injection  40 mg Subcutaneous Q24H  . feeding supplement (ENSURE COMPLETE)  237 mL Oral BID BM  . folic acid  1 mg Oral Daily  . methylPREDNISolone  4 mg Oral QHS  . [START ON 12/25/2014] oyster calcium  1,250 mg Oral QODAY  . piperacillin-tazobactam (ZOSYN)  IV  3.375 g Intravenous 3 times per day  . potassium chloride  30 mEq Oral Q4H  . potassium chloride  10 mEq Intravenous Q1 Hr x 5  . propranolol  20 mg Oral BID  . vancomycin  750 mg Intravenous Q24H  . vitamin B-12  100 mcg Oral Daily   Continuous Infusions: . sodium chloride 50 mL/hr at 12/23/14 2007    Time spent: 35 minutes.  The patient is medically complex and requires high complexity decision making.    LOS: 1 day   RAMA,CHRISTINA  [facility] Pager [phone]. If unable to reach me by pager, please call my cell phone at [phone].  *Please refer to [email or web address], password [email or web address] to get updated schedule on who will round on this patient, as hospitalists switch teams weekly. If 7PM-7AM, please contact night-coverage at [email or web address], password [email or web address] for any overnight needs.  12/24/2014, 10:25 AM

## 2014-12-24 NOTE — Progress Notes (Signed)
ANTIBIOTIC CONSULT NOTE   Pharmacy Consult for Zosyn, Vancomycin, Acyclovir Indication: Cellulitis, R chest/back zoster  Allergies  Allergen Reactions  . Codeine Nausea And Vomiting  . Clarithromycin Nausea And Vomiting and Hypertension    Patient Measurements: Height: 5\' 1"  (154.9 cm) Weight: 101 lb 6.6 oz (46 kg) IBW/kg (Calculated) : 47.8  Vital Signs: Temp: 98.9 F (37.2 C) (01/03 0620) Temp Source: Oral (01/03 0620) BP: 100/50 mmHg (01/03 1017) Pulse Rate: 88 (01/03 1017) Intake/Output from previous day: 01/02 0701 - 01/03 0700 In: 766.7 [P.O.:240; I.V.:526.7] Out: 800 [Urine:800] Intake/Output from this shift: Total I/O In: 240 [P.O.:240] Out: -   Labs:  Recent Labs  12/23/14 1553 12/24/14 0416  WBC 7.9 7.9  HGB 15.3* 13.4  PLT 133* 114*  CREATININE 0.90 0.75   Estimated Creatinine Clearance: 37.3 mL/min (by C-G formula based on Cr of 0.75). No results for input(s): VANCOTROUGH, VANCOPEAK, VANCORANDOM, GENTTROUGH, GENTPEAK, GENTRANDOM, TOBRATROUGH, TOBRAPEAK, TOBRARND, AMIKACINPEAK, AMIKACINTROU, AMIKACIN in the last 72 hours.   Microbiology: No results found for this or any previous visit (from the past 720 hour(s)).  Medical History: Past Medical History  Diagnosis Date  . Rheumatoid arthritis   . Irregular heartbeat   . Osteoporosis    Assessment: 81 y/o F with PMH of RA, irregular heartbeat, osteoporosis who presents 1/2 with rash, fever, generalized weakness. Patient has had poor appetite, nausea, one episode of vomiting a few days ago, and recent productive cough. Pharmacy consulted to assist with dosing of Vancomycin and Zosyn for severe cellulitis, as well as Acyclovir for shingles.  1/2 >> Ceftriaxone x 1 1/2 >> Valtrex x 1 1/2 >> Vancomycin >> 1/2 >> Zosyn >> 1/2 >> Acyclovir >>   Tmax: 101.4 F WBCs: WNL (on low-dose PTA steroids) Renal: SCr WNL, CrCl normalized 14ml/min (using Scr = 0.8)  1/2 blood x 2: sent 1/2 urine:  sent  Goal of Therapy:  Vancomycin trough level 10-15 mcg/ml  Appropriate antibiotic dosing for renal function and indication Eradication of infection  Plan:  Continue Zosyn 3.375g IV q8h (infuse over 4 hours). Continue Vancomycin 750 mg IV q24h  Plan for Vancomycin trough level at steady state. Based on normalized CrCl, adjust Acyclovir 10 mg/kg (450 mg) IV q8h. Monitor renal function, cultures, clinical course.  59m, PharmD, BCPS.   Pager: Juliette Alcide 12/24/2014 3:10 PM

## 2014-12-24 NOTE — Progress Notes (Signed)
At 0200 pt had not voided at least since she arrived to the floor from the ED. Bladder scan performed and it showed 637 cc's in her bladder. Patient denied any bladder pain, and she attempted to void using the bedpan, but was unsuccessful. Hospitalist on call made aware and new order received for I & O cath. Patient was I & 0 catherized for 800 cc's clear, amber urine with a foul odor, and UA C&S sent to lab. The patient tolerated the procedure well.  She is currently resting well. Will continue to monitor for further voiding.

## 2014-12-25 DIAGNOSIS — R4702 Dysphasia: Secondary | ICD-10-CM | POA: Diagnosis present

## 2014-12-25 DIAGNOSIS — I359 Nonrheumatic aortic valve disorder, unspecified: Secondary | ICD-10-CM

## 2014-12-25 LAB — BASIC METABOLIC PANEL
ANION GAP: 7 (ref 5–15)
BUN: 20 mg/dL (ref 6–23)
CO2: 22 mmol/L (ref 19–32)
Calcium: 7.2 mg/dL — ABNORMAL LOW (ref 8.4–10.5)
Chloride: 100 mEq/L (ref 96–112)
Creatinine, Ser: 0.66 mg/dL (ref 0.50–1.10)
GFR, EST NON AFRICAN AMERICAN: 78 mL/min — AB (ref >90)
Glucose, Bld: 128 mg/dL — ABNORMAL HIGH (ref 70–99)
POTASSIUM: 4.1 mmol/L (ref 3.5–5.1)
SODIUM: 129 mmol/L — AB (ref 135–145)

## 2014-12-25 LAB — MAGNESIUM: MAGNESIUM: 1.5 mg/dL (ref 1.5–2.5)

## 2014-12-25 LAB — URINE CULTURE
COLONY COUNT: NO GROWTH
CULTURE: NO GROWTH

## 2014-12-25 LAB — CBC
HEMATOCRIT: 37.3 % (ref 36.0–46.0)
Hemoglobin: 12.5 g/dL (ref 12.0–15.0)
MCH: 31.8 pg (ref 26.0–34.0)
MCHC: 33.5 g/dL (ref 30.0–36.0)
MCV: 94.9 fL (ref 78.0–100.0)
Platelets: 116 10*3/uL — ABNORMAL LOW (ref 150–400)
RBC: 3.93 MIL/uL (ref 3.87–5.11)
RDW: 14.6 % (ref 11.5–15.5)
WBC: 9 10*3/uL (ref 4.0–10.5)

## 2014-12-25 NOTE — Progress Notes (Addendum)
Progress Note   Mackenzie Flores ZDG:[id number] DOB: [birth year] DOA: 12/23/2014 PCP: Michiel Sites, MD   Brief Narrative:   Mackenzie Flores is an 81 y.o. female  with a PMH of RA/irregular heartbeat who presented to the hospital with a chief complaint of rash, fever, generalized weakness, admitted with cellulitis in the setting of shingles eruption. Upon initial evaluation, the patient was also noted to be mildly hyponatremic with a mild elevation of troponin level thought to be secondary to demand ischemia.  Assessment/Plan:   Principal Problem:  Cellulitis, severe, in immunosuppressed host with a shingles outbreak  Hold Plaquenil but continue stress dose steroids. Can begin to wean to maintenance dose of prednisone when hemodynamically stable.  Continue vancomycin/Zosyn.  Continue acyclovir.  Follow-up blood cultures x 2, negative to date.  Active Problems:   Dysphasia  Nursing staff reports significant coughing with attempts to swallow pills.  Speech therapy evaluation requested.    Hypokalemia  Repleted. Magnesium 1.5.   Rheumatoid arthritis  Continue to hold Plaquanil.  Continue steroids (currently on stress dose).   Hyponatremia  Sodium level slightly up today, but continues to be low. Possibly from adrenal suppression. Stress dose steroids started.   Elevated troponin I level  No chest pain or acute ischemic changes on EKG.  Suspect demand ischemia.  Troponin reached a high of 0.23 before beginning to trend down.  Continue ASA, Inderal.  Check two-dimensional echocardiogram.  DVT prophylaxis  Lovenox ordered.  Code Status: DNR, discussed with patient. Family Communication: Daughter Mackenzie Flores [phone]) updated at bedside. Disposition Plan: Home when stable.   IV Access:    Peripheral IV   Procedures and diagnostic studies:   Dg Chest 2 View 12/23/2014: No acute cardiopulmonary abnormality seen.      Medical Consultants:     None.  Anti-Infectives:    Rocephin 12/23/14---> 12/23/14  Vancomycin 12/23/14--->  Zosyn 12/23/14--->  Acyclovir 12/23/14--->  Subjective:   Mackenzie Flores seems a little more alert today, but remains very weak. Continues to deny chest pain, significant dyspnea. Appetite remains poor. Still having pain in the distribution of her herpetic rash.  Objective:    Filed Vitals:   12/24/14 1734 12/24/14 1946 12/25/14 0620 12/25/14 0930  BP: 112/54 110/56 102/54 100/60  Pulse:  73 64 59  Temp:  99 F (37.2 C) 98.9 F (37.2 C) 98 F (36.7 C)  TempSrc:  Axillary Axillary Axillary  Resp:  12 14 14   Height:      Weight:      SpO2:  94% 96% 100%    Intake/Output Summary (Last 24 hours) at 12/25/14 1321 Last data filed at 12/25/14 0816  Gross per 24 hour  Intake    868 ml  Output    550 ml  Net    318 ml    Exam: Gen:  NAD, weak. Cardiovascular:  RRR, No M/R/G Respiratory:  Lungs diminished Gastrointestinal:  Abdomen soft, NT/ND, + BS Extremities:  No C/E/C Skin: No significant change to shingles eruption right breast/flank.  Blood blisters on back. Pictures as noted below. Right breast/flank:  Right flank/back:     Data Reviewed:    Labs: Basic Metabolic Panel:  Recent Labs Lab 12/23/14 1553 12/24/14 0416 12/25/14 0437  NA 132* 126* 129*  K 4.4 2.7* 4.1  CL 93* 94* 100  CO2 25 22 22   GLUCOSE 117* 121* 128*  BUN 22 19 20   CREATININE 0.90 0.75 0.66  CALCIUM 8.3* 7.4* 7.2*  MG  --  1.5 1.5   GFR Estimated Creatinine Clearance: 37.3 mL/min (by C-G formula based on Cr of 0.66). Liver Function Tests:  Recent Labs Lab 12/23/14 1553  AST 67*  ALT 24  ALKPHOS 93  BILITOT 1.9*  PROT 6.3  ALBUMIN 3.5   CBC:  Recent Labs Lab 12/23/14 1553 12/24/14 0416 12/25/14 0437  WBC 7.9 7.9 9.0  NEUTROABS 6.5  --   --   HGB 15.3* 13.4 12.5  HCT 45.7 40.8 37.3  MCV 95.2 94.9 94.9  PLT 133* 114* 116*   Cardiac Enzymes:  Recent Labs Lab 12/23/14 1553  12/23/14 2200 12/24/14 0416  TROPONINI 0.10* 0.23* 0.15*   Sepsis Labs:  Recent Labs Lab 12/23/14 1553 12/23/14 1609 12/24/14 0416 12/25/14 0437  WBC 7.9  --  7.9 9.0  LATICACIDVEN  --  2.83*  --   --    Microbiology Recent Results (from the past 240 hour(s))  Blood culture (routine x 2)     Status: None (Preliminary result)   Collection Time: 12/23/14  3:53 PM  Result Value Ref Range Status   Specimen Description BLOOD LEFT ARM  Final   Special Requests BOTTLES DRAWN AEROBIC ONLY 2CC BLUE BOTTLES  Final   Culture   Final           BLOOD CULTURE RECEIVED NO GROWTH TO DATE CULTURE WILL BE HELD FOR 5 DAYS BEFORE ISSUING A FINAL NEGATIVE REPORT Performed at [facility]    Report Status PENDING  Incomplete  Blood culture (routine x 2)     Status: None (Preliminary result)   Collection Time: 12/23/14  8:24 PM  Result Value Ref Range Status   Specimen Description BLOOD R ARM  Final   Special Requests BOTTLES DRAWN AEROBIC ONLY 2CC  Final   Culture   Final           BLOOD CULTURE RECEIVED NO GROWTH TO DATE CULTURE WILL BE HELD FOR 5 DAYS BEFORE ISSUING A FINAL NEGATIVE REPORT Performed at [facility]    Report Status PENDING  Incomplete     Medications:   . acyclovir  450 mg Intravenous 3 times per day  . antiseptic oral rinse  7 mL Mouth Rinse BID  . aspirin EC  81 mg Oral Daily  . enoxaparin (LOVENOX) injection  40 mg Subcutaneous Q24H  . feeding supplement (ENSURE COMPLETE)  237 mL Oral BID BM  . folic acid  1 mg Oral Daily  . hydrocortisone sod succinate (SOLU-CORTEF) inj  50 mg Intravenous 4 times per day  . oyster calcium  1,250 mg Oral QODAY  . piperacillin-tazobactam (ZOSYN)  IV  3.375 g Intravenous 3 times per day  . propranolol  20 mg Oral BID  . vancomycin  750 mg Intravenous Q24H  . vitamin B-12  100 mcg Oral Daily   Continuous Infusions: . sodium chloride 50 mL/hr at 12/25/14 1042    Time spent: 35 minutes.  The patient is medically  complex and requires high complexity decision making.    LOS: 2 days   Mackenzie Flores  [facility] Pager [phone]. If unable to reach me by pager, please call my cell phone at [phone].  *Please refer to [email or web address], password [email or web address] to get updated schedule on who will round on this patient, as hospitalists switch teams weekly. If 7PM-7AM, please contact night-coverage at [email or web address], password [email or web address] for any overnight needs.  12/25/2014, 1:21 PM

## 2014-12-25 NOTE — Evaluation (Signed)
Clinical/Bedside Swallow Evaluation Patient Details  Name: Mackenzie Flores MRN: [id number] Date of Birth: 1927-11-08  Today's Date: 12/25/2014 Time: 1420-1520 SLP Time Calculation (min) (ACUTE ONLY): 60 min  Past Medical History:  Past Medical History  Diagnosis Date  . Rheumatoid arthritis   . Irregular heartbeat   . Osteoporosis    Past Surgical History:  Past Surgical History  Procedure Laterality Date  . Appendectomy    . Abdominal hysterectomy    . Cataract surgery     HPI:  81 yo female adm to [facility] with cellulitis - diagnosed with shingles.  Pt with PMH + irregular heartbeat - pt found to be hyponatremic and elevated troponins.  Swallow evaluation ordered.  Daughter present during evaluation.     Assessment / Plan / Recommendation Clinical Impression  Pt reports chronic dysphagia x at least four months that is now mildly exacerbated likely due to her shingles (? nerve related) and deconditioning.  Pt with no overt focal CN deficits during eval nor h/o neurological diagnoses.  Chronic throat clearing noted prior to, during and after intake which pt and daughter state is baseline "as long as can recall".   Pt reports throat pain today after trying to take a pill that caused her to gag. She reports pills clear easier with liquids.  Chronic throat clearing and delayed coughing noted across all liquid consistencies.  No subjective improvement noted with thicker consistency than with thin.  Recommend continue diet with precautions as pt CXR was clear and this is a chronic issue.  Pt may benefit from [facility] after she is released from airborne precautions to help determine mitigation strategies given worsening symptoms currently and ? If dysphagia may be contributing to weight loss.  Using teach back,, mitigation strategies reviewed.  Will follow to aid dysphagia management.     Aspiration Risk  Moderate    Diet Recommendation Regular;Thin liquid   Liquid Administration via:  Cup;Straw Medication Administration:  (defer to pt) Supervision: Patient able to self feed Compensations: Slow rate;Small sips/bites (start meals or intake with liquids) Postural Changes and/or Swallow Maneuvers: Seated upright 90 degrees;Upright 30-60 min after meal    Other  Recommendations Oral Care Recommendations: Oral care BID   Follow Up Recommendations  None    Frequency and Duration min 2x/week  2 weeks   Pertinent Vitals/Pain Afebrile, decreased     Swallow Study Prior Functional Status   see HHX    General Date of Onset: 12/25/14 HPI: 81 yo female adm to [facility] with cellulitis - diagnosed with shingles.  Pt with PMH + irregular heartbeat - pt found to be hyponatremic and elevated troponins.  Swallow evaluation ordered.  Daughter present during evaluation.   Type of Study: Bedside swallow evaluation Diet Prior to this Study: Regular;Thin liquids Temperature Spikes Noted: Yes (low grade) Respiratory Status: Room air History of Recent Intubation: No Behavior/Cognition: Alert;Cooperative;Pleasant mood Oral Cavity - Dentition: Dentures, top Self-Feeding Abilities: Able to feed self Patient Positioning: Upright in bed Baseline Vocal Quality: Hoarse Volitional Cough: Weak Volitional Swallow: Able to elicit    Oral/Motor/Sensory Function Overall Oral Motor/Sensory Function: Appears within functional limits for tasks assessed   Ice Chips Ice chips: Impaired Presentation: Spoon Pharyngeal Phase Impairments: Throat Clearing - Immediate;Decreased hyoid-laryngeal movement   Thin Liquid Thin Liquid: Impaired Presentation: Cup;Spoon Pharyngeal  Phase Impairments: Cough - Delayed;Throat Clearing - Immediate;Multiple swallows;Decreased hyoid-laryngeal movement    Nectar Thick Nectar Thick Liquid: Impaired Presentation: Cup;Spoon Pharyngeal Phase Impairments: Decreased hyoid-laryngeal movement;Multiple swallows;Throat Clearing -  Immediate   Honey Thick Honey Thick Liquid: Not  tested   Puree Puree: Impaired Pharyngeal Phase Impairments: Multiple swallows   Solid   GO    Solid: Impaired Pharyngeal Phase Impairments: Multiple swallows      Donavan Burnet, MS [facility] SLP [phone]

## 2014-12-25 NOTE — Progress Notes (Signed)
  Echocardiogram 2D Echocardiogram has been performed.  Arvil Chaco 12/25/2014, 4:10 PM

## 2014-12-26 LAB — BASIC METABOLIC PANEL
Anion gap: 8 (ref 5–15)
BUN: 13 mg/dL (ref 6–23)
CO2: 22 mmol/L (ref 19–32)
CREATININE: 0.55 mg/dL (ref 0.50–1.10)
Calcium: 8 mg/dL — ABNORMAL LOW (ref 8.4–10.5)
Chloride: 104 mEq/L (ref 96–112)
GFR calc Af Amer: >90 mL/min (ref >90)
GFR, EST NON AFRICAN AMERICAN: 83 mL/min — AB (ref >90)
Glucose, Bld: 120 mg/dL — ABNORMAL HIGH (ref 70–99)
POTASSIUM: 3.5 mmol/L (ref 3.5–5.1)
Sodium: 134 mmol/L — ABNORMAL LOW (ref 135–145)

## 2014-12-26 LAB — VANCOMYCIN, TROUGH: Vancomycin Tr: 6.7 ug/mL — ABNORMAL LOW (ref 10.0–20.0)

## 2014-12-26 LAB — CBC
HCT: 38.5 % (ref 36.0–46.0)
Hemoglobin: 12.8 g/dL (ref 12.0–15.0)
MCH: 31.3 pg (ref 26.0–34.0)
MCHC: 33.2 g/dL (ref 30.0–36.0)
MCV: 94.1 fL (ref 78.0–100.0)
Platelets: 123 10*3/uL — ABNORMAL LOW (ref 150–400)
RBC: 4.09 MIL/uL (ref 3.87–5.11)
RDW: 14.5 % (ref 11.5–15.5)
WBC: 10.8 10*3/uL — AB (ref 4.0–10.5)

## 2014-12-26 MED ORDER — ENSURE PUDDING PO PUDG
1.0000 | Freq: Two times a day (BID) | ORAL | Status: DC
  Administered 2014-12-27 – 2015-01-01 (×9): 1 via ORAL
  Filled 2014-12-26 (×15): qty 1

## 2014-12-26 MED ORDER — BOOST / RESOURCE BREEZE PO LIQD
1.0000 | Freq: Two times a day (BID) | ORAL | Status: DC
  Administered 2014-12-28 – 2015-01-01 (×6): 1 via ORAL

## 2014-12-26 MED ORDER — ONDANSETRON HCL 4 MG/2ML IJ SOLN
4.0000 mg | Freq: Four times a day (QID) | INTRAMUSCULAR | Status: DC | PRN
  Administered 2014-12-27: 4 mg via INTRAVENOUS
  Filled 2014-12-26 (×2): qty 2

## 2014-12-26 MED ORDER — VANCOMYCIN HCL IN DEXTROSE 750-5 MG/150ML-% IV SOLN
750.0000 mg | Freq: Two times a day (BID) | INTRAVENOUS | Status: DC
  Administered 2014-12-27 – 2014-12-29 (×5): 750 mg via INTRAVENOUS
  Filled 2014-12-26 (×6): qty 150

## 2014-12-26 NOTE — Progress Notes (Signed)
ANTIBIOTIC CONSULT NOTE   Pharmacy Consult for Zosyn, Vancomycin, Acyclovir Indication: Cellulitis, R chest/back zoster  Allergies  Allergen Reactions  . Codeine Nausea And Vomiting  . Clarithromycin Nausea And Vomiting and Hypertension    Patient Measurements: Height: 5\' 1"  (154.9 cm) Weight: 101 lb 6.6 oz (46 kg) IBW/kg (Calculated) : 47.8  Vital Signs: Temp: 97.5 F (36.4 C) (01/05 0435) Temp Source: Oral (01/05 0435) BP: 101/64 mmHg (01/05 0435) Pulse Rate: 60 (01/05 0435) Intake/Output from previous day: 01/04 0701 - 01/05 0700 In: 1716 [I.V.:1407; IV Piggyback:309] Out: 275 [Urine:275] Intake/Output from this shift:    Labs:  Recent Labs  12/24/14 0416 12/25/14 0437 12/26/14 0500  WBC 7.9 9.0 10.8*  HGB 13.4 12.5 12.8  PLT 114* 116* 123*  CREATININE 0.75 0.66 0.55   Estimated Creatinine Clearance: 37.3 mL/min (by C-G formula based on Cr of 0.55). No results for input(s): VANCOTROUGH, VANCOPEAK, VANCORANDOM, GENTTROUGH, GENTPEAK, GENTRANDOM, TOBRATROUGH, TOBRAPEAK, TOBRARND, AMIKACINPEAK, AMIKACINTROU, AMIKACIN in the last 72 hours.   Microbiology: Recent Results (from the past 720 hour(s))  Blood culture (routine x 2)     Status: None (Preliminary result)   Collection Time: 12/23/14  3:53 PM  Result Value Ref Range Status   Specimen Description BLOOD LEFT ARM  Final   Special Requests BOTTLES DRAWN AEROBIC ONLY 2CC BLUE BOTTLES  Final   Culture   Final           BLOOD CULTURE RECEIVED NO GROWTH TO DATE CULTURE WILL BE HELD FOR 5 DAYS BEFORE ISSUING A FINAL NEGATIVE REPORT Performed at 02/21/15    Report Status PENDING  Incomplete  Blood culture (routine x 2)     Status: None (Preliminary result)   Collection Time: 12/23/14  8:24 PM  Result Value Ref Range Status   Specimen Description BLOOD R ARM  Final   Special Requests BOTTLES DRAWN AEROBIC ONLY 2CC  Final   Culture   Final           BLOOD CULTURE RECEIVED NO GROWTH TO DATE CULTURE  WILL BE HELD FOR 5 DAYS BEFORE ISSUING A FINAL NEGATIVE REPORT Performed at 02/21/15    Report Status PENDING  Incomplete  Urine culture     Status: None   Collection Time: 12/24/14  3:18 AM  Result Value Ref Range Status   Specimen Description URINE, CATHETERIZED  Final   Special Requests NONE  Final   Colony Count NO GROWTH Performed at 02/22/15   Final   Culture NO GROWTH Performed at [facility]   Final   Report Status 12/25/2014 FINAL  Final    Medical History: Past Medical History  Diagnosis Date  . Rheumatoid arthritis   . Irregular heartbeat   . Osteoporosis    Assessment: 81 y/o F with PMH of RA, irregular heartbeat, osteoporosis who presents 1/2 with rash, fever, generalized weakness. Patient has had poor appetite, nausea, one episode of vomiting a few days ago, and recent productive cough. Pharmacy consulted to assist with dosing of Vancomycin and Zosyn for severe cellulitis, as well as Acyclovir for shingles.  1/2 >> Ceftriaxone x 1 1/2 >> Valtrex x 1 1/2 >> Vancomycin >> 1/2 >> Zosyn >> 1/2 >> Acyclovir >>   Tmax: afebrile WBCs: 10.8 (on low-dose PTA steroids) Renal: SCr WNL, CrCl normalized 59ml/min (using Scr = 0.8) and 37 CG  1/2 blood x 2: ngtd 1/2 urine: ngtd  Goal of Therapy:  Vancomycin trough level 10-15 mcg/ml  Appropriate antibiotic dosing for renal function and indication Eradication of infection  Plan:  Continue Zosyn 3.375g IV q8h (infuse over 4 hours). Continue Vancomycin 750 mg IV q24h  Check Vanc trough tonight prior to 6pm dose which will be 4th dose Continue acyclovir 10 mg/kg (450 mg) IV q8h. Monitor renal function, cultures, clinical course.  Hessie Knows, PharmD, BCPS Pager [phone] 12/26/2014 8:56 AM

## 2014-12-26 NOTE — Progress Notes (Signed)
[facility] PROGRESS NOTE.  Mackenzie Flores:[id number] DOB: 1927/07/28    PCP:   Michiel Sites, MD   Mackenzie Flores is an 81 y.o. female with hx of RA on immunosuppressive meds, admitted for shingle along with possible cellulitis, and elevated troponins (0.23 peak)  Rewiew of Systems:  Constitutional: Negative for malaise, fever and chills. No significant weight loss or weight gain Eyes: Negative for eye pain, redness and discharge, diplopia, visual changes, or flashes of light. ENMT: Negative for ear pain, hoarseness, nasal congestion, sinus pressure and sore throat. No headaches; tinnitus, drooling, or problem swallowing. Cardiovascular: Negative for chest pain, palpitations, diaphoresis, dyspnea and peripheral edema. ; No orthopnea, PND Respiratory: Negative for cough, hemoptysis, wheezing and stridor. No pleuritic chestpain. Gastrointestinal: Negative for nausea, vomiting, diarrhea, constipation, abdominal pain, melena, blood in stool, hematemesis, jaundice and rectal bleeding.    Genitourinary: Negative for frequency, dysuria, incontinence,flank pain and hematuria; Musculoskeletal: Negative for back pain and neck pain. Negative for swelling and trauma.;  Skin: . Negative for pruritus, rash, abrasions, bruising and skin lesion.; ulcerations Neuro: Negative for headache, lightheadedness and neck stiffness. Negative for weakness, altered level of consciousness , altered mental status, extremity weakness, burning feet, involuntary movement, seizure and syncope.  Psych: negative for anxiety, depression, insomnia, tearfulness, panic attacks, hallucinations, paranoia, suicidal or homicidal ideation    Past Medical History  Diagnosis Date  . Rheumatoid arthritis   . Irregular heartbeat   . Osteoporosis     Past Surgical History  Procedure Laterality Date  . Appendectomy    . Abdominal hysterectomy    . Cataract surgery       Medications:Rocephin 12/23/14--->  12/23/14  Vancomycin 12/23/14--->  Zosyn 12/23/14---> Acyclovir 12/23/14--->  Allergies:  Allergies  Allergen Reactions  . Codeine Nausea And Vomiting  . Clarithromycin Nausea And Vomiting and Hypertension    Social History:   reports that she has never smoked. She has never used smokeless tobacco. She reports that she does not drink alcohol. Her drug history is not on file.  Family History: Family History  Problem Relation Age of Onset  . Cancer Sister     Brain cancer  . Cancer Brother     Lung cancer  . Stroke Father   . Heart attack Mother      Physical Exam: Filed Vitals:   12/25/14 1334 12/25/14 1718 12/25/14 1900 12/26/14 0435  BP: 100/66 110/70 105/60 101/64  Pulse: 58 61 60 60  Temp: 97.4 F (36.3 C) 97.6 F (36.4 C) 98.1 F (36.7 C) 97.5 F (36.4 C)  TempSrc: Axillary Axillary Oral Oral  Resp: 20   16  Height:      Weight:      SpO2: 97% 98% 98% 99%   Blood pressure 101/64, pulse 60, temperature 97.5 F (36.4 C), temperature source Oral, resp. rate 16, height 5\' 1"  (1.549 m), weight 46 kg (101 lb 6.6 oz), SpO2 99 %.  GEN:  Pleasant  patient lying in the stretcher in no acute distress; cooperative with exam. PSYCH:  alert and oriented x4; does not appear anxious or depressed; affect is appropriate. HEENT: Mucous membranes pink and anicteric; PERRLA; EOM intact; no cervical lymphadenopathy nor thyromegaly or carotid bruit; no JVD; There were no stridor. Neck is very supple. Breasts:: Not examined CHEST WALL: No tenderness CHEST: Normal respiration, clear to auscultation bilaterally.  HEART: Regular rate and rhythm.  There are no murmur, rub, or gallops.   BACK: No kyphosis or scoliosis; no CVA tenderness  ABDOMEN: soft and non-tender; no masses, no organomegaly, normal abdominal bowel sounds; no pannus; no intertriginous candida. There is no rebound and no distention. Rectal Exam: Not done EXTREMITIES: No bone or joint deformity; age-appropriate arthropathy of  the hands and knees; no edema; no ulcerations.  There is no calf tenderness. Genitalia: not examined PULSES: 2+ and symmetric SKIN: Normal hydration no rash or ulceration, lesion on back and right flank, under right breast.  Hadn't crusted as yet. CNS: Cranial nerves 2-12 grossly intact no focal lateralizing neurologic deficit.  Speech is fluent; uvula elevated with phonation, facial symmetry and tongue midline. DTR are normal bilaterally, cerebella exam is intact, barbinski is negative and strengths are equaled bilaterally.  No sensory loss.   Labs on Admission:  Basic Metabolic Panel:  Recent Labs Lab 12/23/14 1553 12/24/14 0416 12/25/14 0437 12/26/14 0500  NA 132* 126* 129* 134*  K 4.4 2.7* 4.1 3.5  CL 93* 94* 100 104  CO2 25 22 22 22   GLUCOSE 117* 121* 128* 120*  BUN 22 19 20 13   CREATININE 0.90 0.75 0.66 0.55  CALCIUM 8.3* 7.4* 7.2* 8.0*  MG  --  1.5 1.5  --    Liver Function Tests:  Recent Labs Lab 12/23/14 1553  AST 67*  ALT 24  ALKPHOS 93  BILITOT 1.9*  PROT 6.3  ALBUMIN 3.5   No results for input(s): LIPASE, AMYLASE in the last 168 hours. No results for input(s): AMMONIA in the last 168 hours. CBC:  Recent Labs Lab 12/23/14 1553 12/24/14 0416 12/25/14 0437 12/26/14 0500  WBC 7.9 7.9 9.0 10.8*  NEUTROABS 6.5  --   --   --   HGB 15.3* 13.4 12.5 12.8  HCT 45.7 40.8 37.3 38.5  MCV 95.2 94.9 94.9 94.1  PLT 133* 114* 116* 123*   Cardiac Enzymes:  Recent Labs Lab 12/23/14 1553 12/23/14 2200 12/24/14 0416  TROPONINI 0.10* 0.23* 0.15*    Assessment/Plan Present on Admission:  . Cellulitis, severe, in immunosuppressed host . Shingles outbreak . Immunosuppressed status . Rheumatoid arthritis . Hyponatremia . Elevated troponin I level . Shingles . Dysphasia  PLAN:  Continue with Acyclovir.  Continue with Van/Zosyn.  Lesion of Shingle hadn't crusted as yet.  Will continue with airborn precaution. RA:  Continue steroids.  She is adrenal  insufficient. Elevated troponin:  Demand ischemia likely. Hyponatremia:  134 Today. Other plans as per orders.  Code Status: FULL 02/21/15, MD. [facility] Pager [phone] 7pm to 7am.  12/26/2014, 10:26 AM

## 2014-12-26 NOTE — Progress Notes (Signed)
INITIAL NUTRITION ASSESSMENT  DOCUMENTATION CODES Per approved criteria  -Severe malnutrition in the context of acute illness or injury  Pt meets criteria for severe MALNUTRITION in the context of acute illness as evidenced by 6% weight loss x 1 week and energy intake <50% for >5 days.  INTERVENTION: -Provide Resource Breeze po BID, each supplement provides 250 kcal and 9 grams of protein -Provide Ensure Pudding po BID, each supplement provides 170 kcal and 4 grams of protein -Discontinue Ensure Complete supplements -Encourage PO intake -RD to continue to monitor  NUTRITION DIAGNOSIS: Inadequate oral intake related to poor appetite as evidenced by Poor PO intake <25%.   Goal: Pt to meet >/= 90% of their estimated nutrition needs   Monitor:  PO and supplemental intake, weight, labs, I/O's  Reason for Assessment: consult for nutritional assessment  Admitting Dx: Cellulitis, Shingles  ASSESSMENT: 81 y.o. female with a PMH of OA/irregular heartbeat who presented to the hospital with a chief complaint of rash, fever, generalized weakness.Appetite has been diminished for the past 4 days, and she has had nausea with one episode of vomiting a few days ago.  Pt in room asleep, pt's daughter in room to provide history. Per daughter, pt has not eaten for 1 week. Last night pt's PO was improved.   Per SLP note, pt has had chronic dysphagia x 4 months. Pt with moderate aspiration risk. SLP recommends regular with thin liquids and aspiration precautions (no improvement with thicker liquids). When airborne precautions are lifted, pt may have MBS completed.  Pt is not drinking Ensure at this time, daughter states that she cannot tolerate it. RD to order [employer] instead and Ensure pudding BID.  Per daughter pt has last 7 lb in the last week (6% weight loss x 1 week, significant for time frame). Pt has had no appetite.   Labs reviewed: Low Na  Height: Ht Readings from Last 1  Encounters:  12/23/14 5\' 1"  (1.549 m)    Weight: Wt Readings from Last 1 Encounters:  12/24/14 101 lb 6.6 oz (46 kg)    Ideal Body Weight: 105 lb  % Ideal Body Weight: 96%  Wt Readings from Last 10 Encounters:  12/24/14 101 lb 6.6 oz (46 kg)  08/12/12 115 lb (52.164 kg)    Usual Body Weight: 120 lb  % Usual Body Weight: 84%  BMI:  Body mass index is 19.17 kg/(m^2).  Estimated Nutritional Needs: Kcal: 1600-1800 Protein: 70-80g Fluid: 1.6L/day  Skin: blisters, skin tears, ecchymosis  Diet Order: Diet regular  EDUCATION NEEDS: -No education needs identified at this time   Intake/Output Summary (Last 24 hours) at 12/26/14 0929 Last data filed at 12/26/14 0539  Gross per 24 hour  Intake   1716 ml  Output    150 ml  Net   1566 ml    Last BM: 12/30  Labs:   Recent Labs Lab 12/24/14 0416 12/25/14 0437 12/26/14 0500  NA 126* 129* 134*  K 2.7* 4.1 3.5  CL 94* 100 104  CO2 22 22 22   BUN 19 20 13   CREATININE 0.75 0.66 0.55  CALCIUM 7.4* 7.2* 8.0*  MG 1.5 1.5  --   GLUCOSE 121* 128* 120*    CBG (last 3)  No results for input(s): GLUCAP in the last 72 hours.  Scheduled Meds: . acyclovir  450 mg Intravenous 3 times per day  . antiseptic oral rinse  7 mL Mouth Rinse BID  . aspirin EC  81 mg Oral Daily  .  enoxaparin (LOVENOX) injection  40 mg Subcutaneous Q24H  . feeding supplement (ENSURE COMPLETE)  237 mL Oral BID BM  . folic acid  1 mg Oral Daily  . hydrocortisone sod succinate (SOLU-CORTEF) inj  50 mg Intravenous 4 times per day  . oyster calcium  1,250 mg Oral QODAY  . piperacillin-tazobactam (ZOSYN)  IV  3.375 g Intravenous 3 times per day  . propranolol  20 mg Oral BID  . vancomycin  750 mg Intravenous Q24H  . vitamin B-12  100 mcg Oral Daily    Continuous Infusions: . sodium chloride 50 mL/hr at 12/25/14 1042    Past Medical History  Diagnosis Date  . Rheumatoid arthritis   . Irregular heartbeat   . Osteoporosis     Past Surgical  History  Procedure Laterality Date  . Appendectomy    . Abdominal hysterectomy    . Cataract surgery      Tilda Franco, MS, RD, LDN Pager: [phone] After Hours Pager: [phone]

## 2014-12-26 NOTE — Care Management Note (Signed)
CARE MANAGEMENT NOTE 12/26/2014  Patient:  Mackenzie Flores, Mackenzie Flores   Account Number:  [id number]  Date Initiated:  12/26/2014  Documentation initiated by:  Sandford Craze  Subjective/Objective Assessment:   81 yo admitted with Cellulitis     Action/Plan:   From home   Anticipated DC Date:  12/29/2014   Anticipated DC Plan:  HOME/SELF CARE      DC Planning Services  CM consult      Choice offered to / List presented to:             Status of service:  In process, will continue to follow Medicare Important Message given?   (If response is "NO", the following Medicare IM given date fields will be blank) Date Medicare IM given:   Medicare IM given by:   Date Additional Medicare IM given:   Additional Medicare IM given by:    Discharge Disposition:    Per UR Regulation:  Reviewed for med. necessity/level of care/duration of stay  If discussed at Long Length of Stay Meetings, dates discussed:    Comments:  12/26/14 Sandford Craze RN,BSN,NCM [phone] Chart reviewed.  Pt with Shower chair, Bedside commode/3-in-1, Walker, and [employer]. PT evaluation has been requested to MD.  CM will continue to follow and assist with DC needs.

## 2014-12-26 NOTE — Progress Notes (Signed)
ANTIBIOTIC CONSULT NOTE - Vancomycin  See progress note from Earl Many, PharmD for full details.  In brief, an 81 yo female on day #4 vancomycin, zosyn and acyclovir for cellulitis, chest/back zoster.  Vancomycin trough subtherapeutic = 6.7 on 750mg  IV q24h  Plan:  Increase vancomycin to 750mg  IV q12h  Recheck trough at steady state if continues Follow up renal function & cultures  , PharmD, BCPS Pager: [phone] 12/26/2014 7:09 PM

## 2014-12-26 NOTE — Progress Notes (Signed)
Speech Language Pathology Treatment: Dysphagia  Patient Details Name: Mackenzie Flores MRN: [id number] DOB: 1927/01/24 Today's Date: 12/26/2014 Time: 5188-4166 SLP Time Calculation (min) (ACUTE ONLY): 24 min  Assessment / Plan / Recommendation Clinical Impression  Pt sleeping upon SLP arrival to room.  Awoke with gentle verbal stimulation but remained too sleepy to consume po intake.  Daughter present and reports mother had good intake last pm and pt stated she "didn't cough much".    Reviewed precautions with pt/daughter for aspiration mitigation and recommendations for MBS when pt is able to dc airborne precautions.  SLP received help from RN to move pt up in bed for better positioning when she is ready to consume a meal.   Pt is afebrile with lungs decreased and is on room air.  Recommend continue diet with strict aspiration precautions due to acute on chronic dysphagia.     HPI HPI: 81 yo female adm to [facility] with cellulitis - diagnosed with shingles.  Pt with PMH + irregular heartbeat - pt found to be hyponatremic and elevated troponins.  Swallow evaluation ordered.  Daughter present during evaluation and during follow up today.  Intake reported to be good last night and pt reports she did not cough "too much".     Pertinent Vitals Pain Assessment: No/denies pain  SLP Plan  Continue with current plan of care    Recommendations Supervision: Patient able to self feed Compensations: Slow rate;Small sips/bites (start meal with liquids, use tsps if problematic) Postural Changes and/or Swallow Maneuvers: Seated upright 90 degrees;Upright 30-60 min after meal              Oral Care Recommendations: Oral care BID Follow up Recommendations: None Plan: Continue with current plan of care    GO     Mills Koller, MS [facility] SLP [phone]

## 2014-12-27 DIAGNOSIS — R4702 Dysphasia: Secondary | ICD-10-CM

## 2014-12-27 DIAGNOSIS — E43 Unspecified severe protein-calorie malnutrition: Secondary | ICD-10-CM | POA: Insufficient documentation

## 2014-12-27 MED ORDER — DOXEPIN HCL 10 MG PO CAPS
10.0000 mg | ORAL_CAPSULE | Freq: Every evening | ORAL | Status: DC | PRN
  Filled 2014-12-27: qty 1

## 2014-12-27 NOTE — Progress Notes (Signed)
[facility] PROGRESS NOTE  NAQUITA NAPPIER SWF:[id number] DOB: [birth year]    PCP:   Michiel Sites, MD   HPI: Mackenzie Flores is an 81 y.o. female with hx of RA on immunosuppressive meds, admitted for shingle along with possible cellulitis, and elevated troponins (0.23 peak).  Lesion is still weepy.  She also complained of itching not relieved with benadryl.  Rewiew of Systems:  Constitutional: Negative for malaise, fever and chills. No significant weight loss or weight gain Eyes: Negative for eye pain, redness and discharge, diplopia, visual changes, or flashes of light. ENMT: Negative for ear pain, hoarseness, nasal congestion, sinus pressure and sore throat. No headaches; tinnitus, drooling, or problem swallowing. Cardiovascular: Negative for chest pain, palpitations, diaphoresis, dyspnea and peripheral edema. ; No orthopnea, PND Respiratory: Negative for cough, hemoptysis, wheezing and stridor. No pleuritic chestpain. Gastrointestinal: Negative for nausea, vomiting, diarrhea, constipation, abdominal pain, melena, blood in stool, hematemesis, jaundice and rectal bleeding.    Genitourinary: Negative for frequency, dysuria, incontinence,flank pain and hematuria; Musculoskeletal: Negative for back pain and neck pain. Negative for swelling and trauma.;  Skin: . Negative for pruritus, rash, abrasions, bruising and skin lesion.; ulcerations Neuro: Negative for headache, lightheadedness and neck stiffness. Negative for weakness, altered level of consciousness , altered mental status, extremity weakness, burning feet, involuntary movement, seizure and syncope.  Psych: negative for anxiety, depression, insomnia, tearfulness, panic attacks, hallucinations, paranoia, suicidal or homicidal ideation    Past Medical History  Diagnosis Date  . Rheumatoid arthritis   . Irregular heartbeat   . Osteoporosis     Past Surgical History  Procedure Laterality Date  . Appendectomy    . Abdominal  hysterectomy    . Cataract surgery      Medications:  HOME MEDS: Prior to Admission medications   Medication Sig Start Date End Date Taking? Authorizing Provider  aspirin EC 81 MG tablet Take 81 mg by mouth daily.   Yes Historical Provider, MD  calcium carbonate (OS-CAL) 600 MG TABS tablet Take 600 mg by mouth every other day.   Yes Historical Provider, MD  Cyanocobalamin (VITAMIN B 12 PO) Take 1 tablet by mouth daily.   Yes Historical Provider, MD  diazepam (VALIUM) 5 MG tablet Take 5 mg by mouth 3 (three) times daily.    Yes Historical Provider, MD  folic acid (FOLVITE) 1 MG tablet Take 1 mg by mouth daily.   Yes Historical Provider, MD  furosemide (LASIX) 40 MG tablet Take 40 mg by mouth 2 (two) times daily.   Yes Historical Provider, MD  hydroxychloroquine (PLAQUENIL) 200 MG tablet Take 200 mg by mouth at bedtime.    Yes Historical Provider, MD  methylPREDNISolone (MEDROL) 4 MG tablet Take 4 mg by mouth at bedtime.    Yes Historical Provider, MD  potassium chloride (K-DUR,KLOR-CON) 10 MEQ tablet Take 20 mEq by mouth daily.   Yes Historical Provider, MD  propranolol (INDERAL) 20 MG tablet Take 20 mg by mouth 2 (two) times daily.   Yes Historical Provider, MD  traMADol (ULTRAM) 50 MG tablet Take 50 mg by mouth every 6 (six) hours as needed for moderate pain.    Yes Historical Provider, MD  Triamcinolone Acetonide (KENALOG IJ) Inject 1 each as directed once a week. Got in right knee at dr's office--Wed   Yes Historical Provider, MD     Allergies:  Allergies  Allergen Reactions  . Codeine Nausea And Vomiting  . Clarithromycin Nausea And Vomiting and Hypertension    Social History:  reports that she has never smoked. She has never used smokeless tobacco. She reports that she does not drink alcohol. Her drug history is not on file.  Family History: Family History  Problem Relation Age of Onset  . Cancer Sister     Brain cancer  . Cancer Brother     Lung cancer  . Stroke Father    . Heart attack Mother      Physical Exam: Filed Vitals:   12/26/14 2125 12/27/14 0527 12/27/14 1013 12/27/14 1304  BP: 108/59 99/69 120/47 124/49  Pulse: 77 59 60 58  Temp: 97.7 F (36.5 C) 98 F (36.7 C)  97.5 F (36.4 C)  TempSrc: Oral Oral  Oral  Resp: 14 14  16   Height:      Weight:      SpO2: 99% 99%  99%   Blood pressure 124/49, pulse 58, temperature 97.5 F (36.4 C), temperature source Oral, resp. rate 16, height 5\' 1"  (1.549 m), weight 46 kg (101 lb 6.6 oz), SpO2 99 %.  GEN:  Pleasant  patient lying in the stretcher in no acute distress; cooperative with exam. PSYCH:  alert and oriented x4; does not appear anxious or depressed; affect is appropriate. HEENT: Mucous membranes pink and anicteric; PERRLA; EOM intact; no cervical lymphadenopathy nor thyromegaly or carotid bruit; no JVD; There were no stridor. Neck is very supple. Breasts:: Not examined CHEST WALL: No tenderness CHEST: Normal respiration, clear to auscultation bilaterally.  HEART: Regular rate and rhythm.  There are no murmur, rub, or gallops.   BACK: No kyphosis or scoliosis; no CVA tenderness ABDOMEN: soft and non-tender; no masses, no organomegaly, normal abdominal bowel sounds; no pannus; no intertriginous candida. There is no rebound and no distention. Rectal Exam: Not done EXTREMITIES: No bone or joint deformity; age-appropriate arthropathy of the hands and knees; no edema; no ulcerations.  There is no calf tenderness. Genitalia: not examined PULSES: 2+ and symmetric SKIN: Normal hydration. Lesion is still weeping.  CNS: Cranial nerves 2-12 grossly intact no focal lateralizing neurologic deficit.  Speech is fluent; uvula elevated with phonation, facial symmetry and tongue midline. DTR are normal bilaterally, cerebella exam is intact, barbinski is negative and strengths are equaled bilaterally.  No sensory loss.   Labs on Admission:  Basic Metabolic Panel:  Recent Labs Lab 12/23/14 1553  12/24/14 0416 12/25/14 0437 12/26/14 0500  NA 132* 126* 129* 134*  K 4.4 2.7* 4.1 3.5  CL 93* 94* 100 104  CO2 25 22 22 22   GLUCOSE 117* 121* 128* 120*  BUN 22 19 20 13   CREATININE 0.90 0.75 0.66 0.55  CALCIUM 8.3* 7.4* 7.2* 8.0*  MG  --  1.5 1.5  --    Liver Function Tests:  Recent Labs Lab 12/23/14 1553  AST 67*  ALT 24  ALKPHOS 93  BILITOT 1.9*  PROT 6.3  ALBUMIN 3.5   No results for input(s): LIPASE, AMYLASE in the last 168 hours. No results for input(s): AMMONIA in the last 168 hours. CBC:  Recent Labs Lab 12/23/14 1553 12/24/14 0416 12/25/14 0437 12/26/14 0500  WBC 7.9 7.9 9.0 10.8*  NEUTROABS 6.5  --   --   --   HGB 15.3* 13.4 12.5 12.8  HCT 45.7 40.8 37.3 38.5  MCV 95.2 94.9 94.9 94.1  PLT 133* 114* 116* 123*   Cardiac Enzymes:  Recent Labs Lab 12/23/14 1553 12/23/14 2200 12/24/14 0416  TROPONINI 0.10* 0.23* 0.15*   Assessment/Plan Present on Admission:  . Cellulitis,  severe, in immunosuppressed host . Shingles outbreak . Immunosuppressed status . Rheumatoid arthritis . Hyponatremia . Elevated troponin I level . Shingles . Dysphasia   PLAN: Continue with IV Acyclovir for shingle, airborn precaution until lesions are crusted.  Continue antibiotics for superimposed cellultis.  Will use Doxepin for off label antipruritic property.  I cautioned it may cause sleepiness.  She is stable.  Other plans as per orders.  Code Status: FULL Unk Lightning, MD. [facility] Pager [phone] 7pm to 7am.  12/27/2014, 5:09 PM

## 2014-12-27 NOTE — Plan of Care (Signed)
Problem: Phase III Progression Outcomes Goal: Voiding independently Outcome: Not Met (add Reason) Patient has foley catheter.

## 2014-12-28 DIAGNOSIS — L03818 Cellulitis of other sites: Secondary | ICD-10-CM

## 2014-12-28 MED ORDER — HYDROCORTISONE NA SUCCINATE PF 100 MG IJ SOLR
50.0000 mg | Freq: Two times a day (BID) | INTRAMUSCULAR | Status: DC
  Administered 2014-12-28 – 2014-12-30 (×5): 50 mg via INTRAVENOUS
  Filled 2014-12-28 (×7): qty 1

## 2014-12-28 MED ORDER — DIPHENHYDRAMINE-ZINC ACETATE 2-0.1 % EX CREA
TOPICAL_CREAM | Freq: Three times a day (TID) | CUTANEOUS | Status: DC | PRN
  Administered 2014-12-28: 11:00:00 via TOPICAL
  Filled 2014-12-28: qty 28

## 2014-12-28 NOTE — Progress Notes (Addendum)
Patient ID: Mackenzie Flores, female   DOB: [birth year], 81 y.o.   MRN: [id number] [facility] PROGRESS NOTE  Mackenzie Flores MCN:[id number] DOB: 1927/02/13 DOA: 12/23/2014 PCP: Michiel Sites, MD  Brief narrative:    81 y.o. female with past medical history of RA on immunosuppressive therapy admitted for shingles, cellulitis . Hospital course complicated with elevated troponin level (Troponi max 0.23).   Assessment/Plan:    Principal Problem:   Cellulitis, severe, in immunosuppressed host / shingles / leukocytosis - apprecaite wound care assessment of the extent of shingle, cellulitis - patient is on acyclovir 450 mg IV every 8 hours  - for cellulitis continue zosyn  And vancomycin  - benadryl for lesions to help with itching   Active Problems:   Rheumatoid arthritis / Immunosuppressed status - patient is on solu-cortef but we  Will start to taper off, currently 50 mg Q 6 hours IV but will reduce to Q 12 hours IV    Hyponatremia - mild, sodium 134 - may continue IV fluids    Elevated troponin I level - likely demand ischemia - no chest pain - follow up 2 D ECHO    Dysphagia - SLP and nutrition consulted     Protein-calorie malnutrition, severe - nutrition consulted    DVT Prophylaxis   Lovenox subQ ordered   Code Status: Full.  Family Communication:  plan of care discussed with the patient's family at the bedside  Disposition Plan: Home when stable. PT eval pending   IV access:  Peripheral IV  Procedures and diagnostic studies:    No results found.  Medical Consultants:  None   Other Consultants:  Wound care Nutrition SLP  IAnti-Infectives:   Acyclovir 12/25/2014 --> Vanco 12/25/2014 --> Zosyn 12/25/2014 -->    Manson Passey, MD  [facility] Pager [phone]  If 7PM-7AM, please contact night-coverage [email or web address] Password [email or web address] 12/28/2014, 3:59 PM   LOS: 5 days    HPI/Subjective: No acute overnight events.  Objective: Filed Vitals:   12/27/14 0527 12/27/14 1013 12/27/14 1304 12/28/14 0458  BP: 99/69 120/47 124/49 106/51  Pulse: 59 60 58 64  Temp: 98 F (36.7 C)  97.5 F (36.4 C) 98.4 F (36.9 C)  TempSrc: Oral  Oral Oral  Resp: 14  16 16   Height:      Weight:      SpO2: 99%  99% 99%    Intake/Output Summary (Last 24 hours) at 12/28/14 1559 Last data filed at 12/28/14 0515  Gross per 24 hour  Intake 1504.4 ml  Output    200 ml  Net 1304.4 ml    Exam:   General:  Pt is not in acute distress  Cardiovascular: Regular rate and rhythm, S1/S2 (+)  Respiratory: Clear to auscultation bilaterally, no wheezing, no crackles, no rhonchi  Abdomen: Soft, non tender, non distended, bowel sounds present  Extremities: multiple ecchymosis on UE, blisters on torso from shingles, pulses DP and PT palpable bilaterally  Neuro: Grossly nonfocal  Data Reviewed: Basic Metabolic Panel:  Recent Labs Lab 12/23/14 1553 12/24/14 0416 12/25/14 0437 12/26/14 0500  NA 132* 126* 129* 134*  K 4.4 2.7* 4.1 3.5  CL 93* 94* 100 104  CO2 25 22 22 22   GLUCOSE 117* 121* 128* 120*  BUN 22 19 20 13   CREATININE 0.90 0.75 0.66 0.55  CALCIUM 8.3* 7.4* 7.2* 8.0*  MG  --  1.5 1.5  --    Liver Function Tests:  Recent Labs Lab 12/23/14 1553  AST 67*  ALT 24  ALKPHOS 93  BILITOT 1.9*  PROT 6.3  ALBUMIN 3.5   No results for input(s): LIPASE, AMYLASE in the last 168 hours. No results for input(s): AMMONIA in the last 168 hours. CBC:  Recent Labs Lab 12/23/14 1553 12/24/14 0416 12/25/14 0437 12/26/14 0500  WBC 7.9 7.9 9.0 10.8*  NEUTROABS 6.5  --   --   --   HGB 15.3* 13.4 12.5 12.8  HCT 45.7 40.8 37.3 38.5  MCV 95.2 94.9 94.9 94.1  PLT 133* 114* 116* 123*   Cardiac Enzymes:  Recent Labs Lab 12/23/14 1553 12/23/14 2200 12/24/14 0416  TROPONINI 0.10* 0.23* 0.15*   BNP: Invalid input(s): POCBNP CBG: No results for input(s): GLUCAP in the last 168 hours.  Blood culture (routine x 2)     Status: None  (Preliminary result)   Collection Time: 12/23/14  3:53 PM  Result Value Ref Range Status   Specimen Description BLOOD LEFT ARM  Final   Special Requests BOTTLES DRAWN AEROBIC ONLY 2CC BLUE BOTTLES  Final   Culture   Final           BLOOD CULTURE RECEIVED NO GROWTH TO DATE    Report Status PENDING  Incomplete  Blood culture (routine x 2)     Status: None (Preliminary result)   Collection Time: 12/23/14  8:24 PM  Result Value Ref Range Status   Specimen Description BLOOD R ARM  Final   Special Requests BOTTLES DRAWN AEROBIC ONLY 2CC  Final   Culture   Final           BLOOD CULTURE RECEIVED NO GROWTH TO DATE    Report Status PENDING  Incomplete  Urine culture     Status: None   Collection Time: 12/24/14  3:18 AM  Result Value Ref Range Status   Specimen Description URINE, CATHETERIZED  Final   Special Requests NONE  Final   Culture NO GROWTH  Final   Report Status 12/25/2014 FINAL  Final     Scheduled Meds: . acyclovir  450 mg Intravenous 3 times per day  . antiseptic oral rinse  7 mL Mouth Rinse BID  . aspirin EC  81 mg Oral Daily  . enoxaparin (LOVENOX) injection  40 mg Subcutaneous Q24H  . feeding supplement (ENSURE)  1 Container Oral BID WC  . feeding supplement (RESOURCE BREEZE)  1 Container Oral BID BM  . folic acid  1 mg Oral Daily  . hydrocortisone sod succinate (SOLU-CORTEF) inj  50 mg Intravenous 4 times per day  . oyster calcium  1,250 mg Oral QODAY  . piperacillin-tazobactam (ZOSYN)  IV  3.375 g Intravenous 3 times per day  . propranolol  20 mg Oral BID  . vancomycin  750 mg Intravenous Q12H  . vitamin B-12  100 mcg Oral Daily   Continuous Infusions: . sodium chloride 50 mL/hr at 12/28/14 [id number]

## 2014-12-28 NOTE — Progress Notes (Signed)
Nutrition Brief Follow-up  RD consulted for nutritional assessment.  RD assessed pt on 1/05. Pt diagnosed with severe malnutrition in the context of acute illness as evidenced by 6% weight loss x 1 week and energy intake <50% for >5 days. RD ordered Ensure pudding BID and Resource Breeze supplements. Pt is consuming both supplements at this time.  RD to continue to monitor.  Tilda Franco, MS, RD, LDN Pager: [phone] After Hours Pager: [phone]

## 2014-12-28 NOTE — Consult Note (Addendum)
WOC wound consult note Reason for Consult:Areas affected by shingles right breast and back Wound type:infectious, in evolution stage of sloughing. Patient does not complain of pain. Pressure Ulcer POA: No Measurement:back:  Continuous area along right check dermatone measuring 10cm x 39cm x 0.2cm. Wound bed: Open wounds are full thickness with dark wound bed and sloughing epithelium Drainage (amount, consistency, odor) None to scant serous Periwound:Intact, dry Dressing procedure/placement/frequency:I will implement covering these lesions with a soft silicone foam dressing until sloughing is complete and areas have crusted or reepithelialized completely to reduce pain and minimize scarring. The dressings also promote comfort. Patient is receiving systemic antiviral and antibiotic coverage. WOC nursing team will not follow, but will remain available to this patient, the nursing and medical team.  Please re-consult if needed. Thanks, Ladona Mow, MSN, RN, GNP, [place], CWON-AP [phone])

## 2014-12-29 DIAGNOSIS — B029 Zoster without complications: Secondary | ICD-10-CM

## 2014-12-29 DIAGNOSIS — R7989 Other specified abnormal findings of blood chemistry: Secondary | ICD-10-CM

## 2014-12-29 DIAGNOSIS — E871 Hypo-osmolality and hyponatremia: Secondary | ICD-10-CM

## 2014-12-29 LAB — CULTURE, BLOOD (ROUTINE X 2): Culture: NO GROWTH

## 2014-12-29 MED ORDER — DOXYCYCLINE HYCLATE 100 MG IV SOLR
100.0000 mg | Freq: Two times a day (BID) | INTRAVENOUS | Status: DC
  Administered 2014-12-29 – 2014-12-31 (×5): 100 mg via INTRAVENOUS
  Filled 2014-12-29 (×6): qty 100

## 2014-12-29 NOTE — Care Management Note (Signed)
Medicare Important Message given?  YES (If response is "NO", the following Medicare IM given date fields will be blank) Date Medicare IM given:  12/29/2014 Medicare IM given by:  Amiliana Foutz 

## 2014-12-29 NOTE — Evaluation (Signed)
Physical Therapy Evaluation Patient Details Name: Mackenzie Flores MRN: [id number] DOB: 1927-01-31 Today's Date: 12/29/2014   History of Present Illness  81 yo female admitted with cellulitis, shingles, weakness. Hx of RA, irregular heartbeat, osteoporosis  Clinical Impression  On eval, pt required Mod assist for mobility-able to perform stand pivot x2 with use of RW. Pt is weak and unsteady but she tolerated session well. Pt experiencing uncontrolled diarrhea during session-2-3 loose stools during session. Deferred ambulation and/or leaving pt up in recliner at this time since stools are so frequent. Recommend SNF for ST rehab.     Follow Up Recommendations SNF    Equipment Recommendations  None recommended by PT    Recommendations for Other Services OT consult     Precautions / Restrictions Precautions Precautions: Fall Precaution Comments: airborne, uncontrolled diarrhea Restrictions Weight Bearing Restrictions: No      Mobility  Bed Mobility Overal bed mobility: Needs Assistance Bed Mobility: Supine to Sit;Sit to Supine     Supine to sit: Mod assist Sit to supine: Mod assist   General bed mobility comments: Assist for trunk and bil LEs. Increased time. Utilized bedpad for scootingk positioning  Transfers Overall transfer level: Needs assistance Equipment used: Rolling walker (2 wheeled) Transfers: Sit to/from [employer] Sit to Stand: Mod assist Stand pivot transfers: Mod assist       General transfer comment: x2, bed<.bsc with RW. Multimodal cues for safety, technique, posture, proper use of walker. Assist to support pt and maneuver with RW. Pt tries to sit before safely positioned  Ambulation/Gait             General Gait Details: NT-pt with uncontrolled diarrhea.   Stairs            Wheelchair Mobility    Modified Rankin (Stroke Patients Only)       Balance Overall balance assessment: Needs assistance Sitting-balance support:  Feet supported;Bilateral upper extremity supported Sitting balance-Leahy Scale: Fair     Standing balance support: Bilateral upper extremity supported;During functional activity Standing balance-Leahy Scale: Poor                               Pertinent Vitals/Pain Pain Assessment: Faces Faces Pain Scale: Hurts little more Pain Location: buttocks from multiple bowel movement    Home Living Family/patient expects to be discharged to:: Private residence Living Arrangements: Children (daughter works) Available Help at Discharge: Family Type of Home: House Home Access: Stairs to enter Entrance Stairs-Rails: Right Entrance Stairs-Number of Steps: 2 Home Layout: One level Home Equipment: Gilmer Mor - quad;Walker - 2 wheels;Bedside commode      Prior Function Level of Independence: Independent with assistive device(s)         Comments: was mod independent until last week or so.      Hand Dominance        Extremity/Trunk Assessment   Upper Extremity Assessment: Generalized weakness           Lower Extremity Assessment: Generalized weakness      Cervical / Trunk Assessment: Kyphotic  Communication   Communication: No difficulties  Cognition Arousal/Alertness: Awake/alert Behavior During Therapy: WFL for tasks assessed/performed Overall Cognitive Status: Within Functional Limits for tasks assessed                      General Comments      Exercises        Assessment/Plan  PT Assessment Patient needs continued PT services  PT Diagnosis Difficulty walking;Abnormality of gait;Generalized weakness   PT Problem List Decreased strength;Decreased activity tolerance;Decreased balance;Decreased mobility;Decreased range of motion;Decreased knowledge of use of DME;Pain;Decreased skin integrity  PT Treatment Interventions DME instruction;Gait training;Functional mobility training;Therapeutic activities;Therapeutic exercise;Patient/family  education;Balance training   PT Goals (Current goals can be found in the Care Plan section) Acute Rehab PT Goals Patient Stated Goal: to try to walk PT Goal Formulation: With patient/family Time For Goal Achievement: 01/12/15 Potential to Achieve Goals: Good    Frequency Min 3X/week   Barriers to discharge        Co-evaluation               End of Session   Activity Tolerance: Patient tolerated treatment well Patient left: in bed;with call bell/phone within reach;with bed alarm set;with family/visitor present           Time: 1040-1120 PT Time Calculation (min) (ACUTE ONLY): 40 min   Charges:   PT Evaluation $Initial PT Evaluation Tier I: 1 Procedure PT Treatments $Therapeutic Activity: 38-52 mins   PT G Codes:        Rebeca Alert, MPT Pager: [phone]

## 2014-12-29 NOTE — Progress Notes (Addendum)
Patient ID: Mackenzie Flores, female   DOB: 1927-10-31, 81 y.o.   MRN: [id number] [facility] PROGRESS NOTE  Mackenzie Flores NLG:[id number] DOB: 16-Nov-1927 DOA: 12/23/2014 PCP: Michiel Sites, MD  Brief narrative:    81 y.o. female with past medical history of RA on immunosuppressive therapy admitted for shingles, cellulitis . Hospital course complicated with elevated troponin level (Troponi max 0.23).  Assessment/Plan:     Principal Problem:   Cellulitis, severe, in immunosuppressed host / shingles / leukocytosis - apprecaite wound care assessment of the extent of shingle, cellulitis. Area: right breast and back nfectious, in evolution stage of sloughing. Continuous area along right back dermatone measuring 10cm x 39cm x 0.2cm. Open wounds are full thickness with dark wound bed and sloughing epithelium. Dressing changes recommendations: Covering the lesions with a soft silicone foam dressing until sloughing is complete and areas have crusted or reepithelialized completely to reduce pain and minimize scarring. The dressings also promote comfort. - patient is on acyclovir 450 mg IV every 8 hours  - for cellulitis, patient was receiving vancomycin and Zosyn. Since no fever and blood cultures to date are negative we will stop vancomycin and Zosyn and start doxycycline 100 mg twice daily IV. - benadryl for lesions to help with itching   Active Problems:   Rheumatoid arthritis / Immunosuppressed status - patient is on solu-cortef 50 mg IV twice daily. Continue to taper down.    Hyponatremia - mild, sodium 134 - may continue IV fluids - Follow-up BMP in the morning.    Elevated troponin I level - likely demand ischemia - no chest pain - 2 D ECHO - EF 60%    Hypertension - Continue propranolol 20 mg twice a day.    Dysphagia - SLP and nutrition consulted     Protein-calorie malnutrition, severe - Nutrition consulted    DVT Prophylaxis   Lovenox subQ ordered   Code Status: DNR/DNI   Family Communication:  plan of care discussed with the patient's family at the bedside  Disposition Plan: Home when stable. PT eval pending   IV access:  Peripheral IV  Procedures and diagnostic studies:    No results found.  Medical Consultants:  None   Other Consultants:  Wound care Nutrition SLP  IAnti-Infectives:   Acyclovir 12/25/2014 --> Doxycyline 12/29/2014 --> Vanco 12/25/2014 --> 12/29/2014  Zosyn 12/25/2014 --> 12/29/2014    Manson Passey, MD  [facility] Pager [phone]  If 7PM-7AM, please contact night-coverage [email or web address] Password [email or web address] 12/29/2014, 10:50 AM   LOS: 6 days    HPI/Subjective: No acute overnight events.  Objective: Filed Vitals:   12/27/14 1304 12/28/14 0458 12/28/14 2333 12/29/14 0633  BP: 124/49 106/51 141/60 124/69  Pulse: 58 64 51 52  Temp: 97.5 F (36.4 C) 98.4 F (36.9 C)  97.4 F (36.3 C)  TempSrc: Oral Oral Axillary Oral  Resp: 16 16 16 16   Height:      Weight:      SpO2: 99% 99% 97% 95%    Intake/Output Summary (Last 24 hours) at 12/29/14 1050 Last data filed at 12/29/14 1012  Gross per 24 hour  Intake 2764.5 ml  Output    750 ml  Net 2014.5 ml    Exam:   General:  Pt is not in acute distress  Cardiovascular: Regular rate and rhythm, S1/S2 (+)  Respiratory: Bilateral air entry, no wheezing  Abdomen: Soft, non tender, non distended, bowel sounds present  Extremities: No edema, pulses DP and PT palpable bilaterally; ecchymosis  in upper extremities, other lesions consistent with shingles  Neuro: Grossly nonfocal  Data Reviewed: Basic Metabolic Panel:  Recent Labs Lab 12/23/14 1553 12/24/14 0416 12/25/14 0437 12/26/14 0500  NA 132* 126* 129* 134*  K 4.4 2.7* 4.1 3.5  CL 93* 94* 100 104  CO2 25 22 22 22   GLUCOSE 117* 121* 128* 120*  BUN 22 19 20 13   CREATININE 0.90 0.75 0.66 0.55  CALCIUM 8.3* 7.4* 7.2* 8.0*  MG  --  1.5 1.5  --    Liver Function Tests:  Recent Labs Lab 12/23/14 1553  AST  67*  ALT 24  ALKPHOS 93  BILITOT 1.9*  PROT 6.3  ALBUMIN 3.5   No results for input(s): LIPASE, AMYLASE in the last 168 hours. No results for input(s): AMMONIA in the last 168 hours. CBC:  Recent Labs Lab 12/23/14 1553 12/24/14 0416 12/25/14 0437 12/26/14 0500  WBC 7.9 7.9 9.0 10.8*  NEUTROABS 6.5  --   --   --   HGB 15.3* 13.4 12.5 12.8  HCT 45.7 40.8 37.3 38.5  MCV 95.2 94.9 94.9 94.1  PLT 133* 114* 116* 123*   Cardiac Enzymes:  Recent Labs Lab 12/23/14 1553 12/23/14 2200 12/24/14 0416  TROPONINI 0.10* 0.23* 0.15*   BNP: Invalid input(s): POCBNP CBG: No results for input(s): GLUCAP in the last 168 hours.  Recent Results (from the past 240 hour(s))  Blood culture (routine x 2)     Status: None   Collection Time: 12/23/14  3:53 PM  Result Value Ref Range Status   Specimen Description BLOOD LEFT ARM  Final   Special Requests BOTTLES DRAWN AEROBIC ONLY 2CC BLUE BOTTLES  Final   Culture   Final    NO GROWTH 5 DAYS   Report Status 12/29/2014 FINAL  Final  Blood culture (routine x 2)     Status: None (Preliminary result)   Collection Time: 12/23/14  8:24 PM  Result Value Ref Range Status   Specimen Description BLOOD R ARM  Final   Special Requests BOTTLES DRAWN AEROBIC ONLY 2CC  Final   Culture   Final           BLOOD CULTURE RECEIVED NO GROWTH TO DATE    Report Status PENDING  Incomplete  Urine culture     Status: None   Collection Time: 12/24/14  3:18 AM  Result Value Ref Range Status   Specimen Description URINE, CATHETERIZED  Final   Special Requests NONE  Final   Culture NO GROWTH  Final   Report Status 12/25/2014 FINAL  Final     Scheduled Meds: . acyclovir  450 mg Intravenous 3 times per day  . antiseptic oral rinse  7 mL Mouth Rinse BID  . aspirin EC  81 mg Oral Daily  . enoxaparin (LOVENOX) injection  40 mg Subcutaneous Q24H  . feeding supplement (ENSURE)  1 Container Oral BID WC  . feeding supplement (RESOURCE BREEZE)  1 Container Oral BID BM   . folic acid  1 mg Oral Daily  . hydrocortisone sod succinate (SOLU-CORTEF) inj  50 mg Intravenous Q12H  . oyster calcium  1,250 mg Oral QODAY  . piperacillin-tazobactam (ZOSYN)  IV  3.375 g Intravenous 3 times per day  . propranolol  20 mg Oral BID  . vancomycin  750 mg Intravenous Q12H  . vitamin B-12  100 mcg Oral Daily   Continuous Infusions: . sodium chloride 50 mL/hr at 12/28/14 [phone]

## 2014-12-29 NOTE — Progress Notes (Signed)
Clinical Social Work Department BRIEF PSYCHOSOCIAL ASSESSMENT 12/29/2014  Patient:  Mackenzie Flores, Mackenzie Flores     Account Number:  [id number]     Admit date:  12/23/2014  Clinical Social Worker:  Garlan Fair  Date/Time:  12/29/2014 01:40 PM  Referred by:  Physician  Date Referred:  12/29/2014 Referred for  SNF Placement   Other Referral:   Interview type:  Patient Other interview type:   and patient daughter at bedside    PSYCHOSOCIAL DATA Living Status:  FAMILY Admitted from facility:   Level of care:   Primary support name:  Dawn Boswell/daughter/[phone] Primary support relationship to patient:  CHILD, ADULT Degree of support available:   adequate    CURRENT CONCERNS Current Concerns  Post-Acute Placement   Other Concerns:    SOCIAL WORK ASSESSMENT / PLAN CSW received referral for New SNF.    CSW visited pt and pt daughter at bedside. Nurse Tech present at this time assisting pt with her lunch. CSW introduced self and explained role. CSW discussed recommendation for short term rehab at SNF prior to pt discharge home. Pt daughter expressed understanding and pt and pt daughter agreeable to initiation of SNF search. CSW discussed that pt may have limited options due to pt shingles, but CSW will explore options and follow up with bed offers. CSW clarified pt daughters questions.    CSW completed FL2 and initiated SNF search in [place].    CSW to follow up with pt and pt daughter re: SNF bed offers.    CSW to continue to follow to provide support and assist with pt discharge planning needs.   Assessment/plan status:  Psychosocial Support/Ongoing Assessment of Needs Other assessment/ plan:   discharge planning needs   Information/referral to community resources:   [facility] list    PATIENT'S/FAMILY'S RESPONSE TO PLAN OF CARE: Pt alert and oriented to person and place. Pt being assisted with lunch at this time. Pt daughter supportive and actively involved  in pt care. Pt daughter eager to know what options pt may have for short term rehab at [facility].    Loletta Specter, MSW, LCSW Clinical Social Work [phone]

## 2014-12-29 NOTE — Progress Notes (Signed)
ANTIBIOTIC CONSULT NOTE - Follow-up   Pharmacy Consult for Acyclovir Indication: R chest/back zoster  Allergies  Allergen Reactions  . Codeine Nausea And Vomiting  . Clarithromycin Nausea And Vomiting and Hypertension    Patient Measurements: Height: 5\' 1"  (154.9 cm) Weight: 101 lb 6.6 oz (46 kg) IBW/kg (Calculated) : 47.8  Vital Signs: Temp: 97.4 F (36.3 C) (01/08 0633) Temp Source: Oral (01/08 12-21-1989) BP: 124/69 mmHg (01/08 12-21-1989) Pulse Rate: 52 (01/08 0633) Intake/Output from previous day: 01/07 0701 - 01/08 0700 In: 2704.5 [P.O.:180; I.V.:1247.5; IV Piggyback:1277] Out: 750 [Urine:750] Intake/Output from this shift: Total I/O In: 60 [P.O.:60] Out: -   Labs: No results for input(s): WBC, HGB, PLT, LABCREA, CREATININE in the last 72 hours. Estimated Creatinine Clearance: 37.3 mL/min (by C-G formula based on Cr of 0.55).  Recent Labs  12/26/14 1718  VANCOTROUGH 6.7*     Microbiology: Recent Results (from the past 720 hour(s))  Blood culture (routine x 2)     Status: None   Collection Time: 12/23/14  3:53 PM  Result Value Ref Range Status   Specimen Description BLOOD LEFT ARM  Final   Special Requests BOTTLES DRAWN AEROBIC ONLY 2CC BLUE BOTTLES  Final   Culture   Final    NO GROWTH 5 DAYS Performed at 02/21/15    Report Status 12/29/2014 FINAL  Final  Blood culture (routine x 2)     Status: None (Preliminary result)   Collection Time: 12/23/14  8:24 PM  Result Value Ref Range Status   Specimen Description BLOOD R ARM  Final   Special Requests BOTTLES DRAWN AEROBIC ONLY 2CC  Final   Culture   Final           BLOOD CULTURE RECEIVED NO GROWTH TO DATE CULTURE WILL BE HELD FOR 5 DAYS BEFORE ISSUING A FINAL NEGATIVE REPORT Performed at 02/21/15    Report Status PENDING  Incomplete  Urine culture     Status: None   Collection Time: 12/24/14  3:18 AM  Result Value Ref Range Status   Specimen Description URINE, CATHETERIZED  Final   Special Requests NONE  Final   Colony Count NO GROWTH Performed at 02/22/15   Final   Culture NO GROWTH Performed at [employer]   Final   Report Status 12/25/2014 FINAL  Final   Assessment: 81 y/o F with PMH of RA, irregular heartbeat, osteoporosis who presents 1/2 with rash, fever, generalized weakness. Patient has had poor appetite, nausea, one episode of vomiting a few days ago, and recent productive cough. Pharmacy consulted to assist with dosing of Vancomycin and Zosyn for severe cellulitis, as well as Acyclovir for shingles.  1/2 >> Ceftriaxone x 1 1/2 >> Valtrex x 1 1/2 >> Vancomycin >> 1/8 1/2 >> Zosyn >> 1/8 1/2 >> Acyclovir >>  1/8 >> Doxy (MD) >>  Tmax: 101.4 F on 1/2, afebrile since WBCs: 10.8, up a little on 1/5 (on low-dose PTA steroids) Renal: SCr WNL, stable. CrCl normalized 30ml/min (using Scr = 0.8) and 37 CG  1/2 blood x 2: ngtd 1/2 urine: NGF  Goal of Therapy:  Appropriate antibiotic dosing for renal function; eradication of infection  Plan:  Continue acyclovir 10 mg/kg (450 mg) IV q8h. Started Doxy 100mg  IV q12h per MD. Monitor renal function, cultures, clinical course.  81m, PharmD, pager [phone]. 12/29/2014,11:46 AM.

## 2014-12-29 NOTE — Progress Notes (Addendum)
Clinical Social Work Department CLINICAL SOCIAL WORK PLACEMENT NOTE 12/29/2014  Patient:  Mackenzie Flores, Mackenzie Flores  Account Number:  [id number] Admit date:  12/23/2014  Clinical Social Worker:  Garlan Fair  Date/time:  12/29/2014 01:54 PM  Clinical Social Work is seeking post-discharge placement for this patient at the following level of care:   SKILLED NURSING   (*CSW will update this form in Epic as items are completed)   12/29/2014  Patient/family provided with [facility] Department of Clinical Social Work's list of facilities offering this level of care within the geographic area requested by the patient (or if unable, by the patient's family).  12/29/2014  Patient/family informed of their freedom to choose among providers that offer the needed level of care, that participate in Medicare, Medicaid or managed care program needed by the patient, have an available bed and are willing to accept the patient.  12/29/2014  Patient/family informed of [facility]' ownership interest in [facility], as well as of the fact that they are under no obligation to receive care at this facility.  PASARR submitted to EDS on 12/29/2014 PASARR number received on 12/29/2014  FL2 transmitted to all facilities in geographic area requested by pt/family on  12/29/2014 FL2 transmitted to all facilities within larger geographic area on   Patient informed that his/her managed care company has contracts with or will negotiate with  certain facilities, including the following:     Patient/family informed of bed offers received:  12/29/2014 Patient chooses bed at [facility] Physician recommends and patient chooses bed at    Patient to be transferred to  on  [facility] on 01/01/2015 Patient to be transferred to facility by ambulance Sharin Mons) Patient and family notified of transfer on 01/01/2015 Name of family member notified:  Pt and pt daughter, Dawn notified at bedside  The following physician request  were entered in Epic:   Additional Comments:   Loletta Specter, MSW, LCSW Clinical Social Work [phone]

## 2014-12-30 DIAGNOSIS — L03313 Cellulitis of chest wall: Secondary | ICD-10-CM

## 2014-12-30 DIAGNOSIS — E876 Hypokalemia: Secondary | ICD-10-CM

## 2014-12-30 LAB — BASIC METABOLIC PANEL
Anion gap: 9 (ref 5–15)
BUN: 11 mg/dL (ref 6–23)
CALCIUM: 7.6 mg/dL — AB (ref 8.4–10.5)
CO2: 24 mmol/L (ref 19–32)
CREATININE: 0.54 mg/dL (ref 0.50–1.10)
Chloride: 108 mEq/L (ref 96–112)
GFR calc Af Amer: >90 mL/min (ref >90)
GFR calc non Af Amer: 84 mL/min — ABNORMAL LOW (ref >90)
GLUCOSE: 106 mg/dL — AB (ref 70–99)
Potassium: 2.3 mmol/L — CL (ref 3.5–5.1)
Sodium: 141 mmol/L (ref 135–145)

## 2014-12-30 LAB — CBC
HEMATOCRIT: 34.2 % — AB (ref 36.0–46.0)
HEMOGLOBIN: 11.3 g/dL — AB (ref 12.0–15.0)
MCH: 31.1 pg (ref 26.0–34.0)
MCHC: 33 g/dL (ref 30.0–36.0)
MCV: 94.2 fL (ref 78.0–100.0)
Platelets: 163 10*3/uL (ref 150–400)
RBC: 3.63 MIL/uL — AB (ref 3.87–5.11)
RDW: 14.7 % (ref 11.5–15.5)
WBC: 7.7 10*3/uL (ref 4.0–10.5)

## 2014-12-30 LAB — CULTURE, BLOOD (ROUTINE X 2): Culture: NO GROWTH

## 2014-12-30 LAB — CLOSTRIDIUM DIFFICILE BY PCR: Toxigenic C. Difficile by PCR: NEGATIVE

## 2014-12-30 MED ORDER — POTASSIUM CHLORIDE 10 MEQ/100ML IV SOLN
10.0000 meq | INTRAVENOUS | Status: DC
Start: 2014-12-30 — End: 2014-12-30
  Administered 2014-12-30 (×4): 10 meq via INTRAVENOUS
  Filled 2014-12-30 (×6): qty 100

## 2014-12-30 MED ORDER — POTASSIUM CHLORIDE CRYS ER 20 MEQ PO TBCR
20.0000 meq | EXTENDED_RELEASE_TABLET | ORAL | Status: AC
  Administered 2014-12-30 (×2): 20 meq via ORAL
  Filled 2014-12-30 (×2): qty 1

## 2014-12-30 NOTE — Progress Notes (Signed)
Speech Language Pathology Treatment: Dysphagia  Patient Details Name: Mackenzie Flores MRN: [id number] DOB: 26-Dec-1926 Today's Date: 12/30/2014 Time: 6226-3335 SLP Time Calculation (min) (ACUTE ONLY): 14 min  Assessment / Plan / Recommendation Clinical Impression  Pt consuming liquids upon SLP entrance to room, bed partially reclined (SLP raised HOB and advised intake with bed completely upright as able).   Mental status appears improved compared to during previous evaluation/tx.  Observed pt self feeding water via straw- immediate and delayed throat clearing noted (likely laryngeal penetration) - no overt coughing.   Pt reports she will "strangle" easily at home on liquids but is better if she takes small boluses. She also reports she does not strangle with solids - only liquids.  Daughter and pt again confirm chronic dysphagia symptoms even prior to admit.   RN reports pt is having some coughing with intake.    As all vitals are stable - pt afebrile, lungs decreased but clear - CXR 1/2 negative - recommend continue regular/thin diet with aspiration mitigation strategies.  Educated Sports coach and all agree.   Recommend follow up at [facility] for dysphagia management and to determine indication for instrumental swallow evaluation.       HPI HPI: 81 yo female adm to [facility] with cellulitis - diagnosed with shingles.  Pt with PMH + irregular heartbeat - pt found to be hyponatremic and elevated troponins.  Swallow evaluation completed during admit, pt has not been able to have MBS due to airborne precautions.    Pertinent Vitals Pain Assessment: No/denies pain  SLP Plan  Continue with current plan of care    Recommendations Diet recommendations: Regular;Thin liquid Liquids provided via: Cup;Straw Medication Administration:  (as tolerated) Supervision:  (defer to pt to determine amount of assistance required (mechanical due to RA)) Compensations: Slow rate;Small sips/bites (start meal with liquids) Postural  Changes and/or Swallow Maneuvers: Seated upright 90 degrees;Upright 30-60 min after meal              Oral Care Recommendations: Oral care BID Follow up Recommendations: Skilled Nursing facility Plan: Continue with current plan of care    GO     Donavan Burnet, MS [facility] SLP [phone]

## 2014-12-30 NOTE — Clinical Social Work Note (Signed)
   CSW met with pt's daughter to present bed offers  Pt's daughter was hoping for [facility] which stated they were considering pending nurse evaluation and [facility] which had not answered yet  CSW stated that she would follow up tomorrow with the two she wanted to see if there was a possible availability and get back to daughter  CSW to follow up tomorrow to see if [facility] responds and place a call to [facility] to find out about nurse evaluation and when that might occur  .Dede Query, LCSW [facility] Clinical Social Worker - Weekend Coverage cell #: [phone]

## 2014-12-30 NOTE — Progress Notes (Signed)
Critical lab value potassium=2.3. On call provider notified. Orders are being put in. Will continue to monitor patient.

## 2014-12-30 NOTE — Progress Notes (Addendum)
Patient ID: Mackenzie Flores, female   DOB: 1927/09/11, 81 y.o.   MRN: [id number] [facility] PROGRESS NOTE  Mackenzie Flores GQQ:[id number] DOB: [birth year] DOA: 12/23/2014 PCP: Michiel Sites, MD  Brief narrative:    81 y.o. female with past medical history of RA on immunosuppressive therapy admitted for shingles, cellulitis . Hospital course complicated with elevated troponin level (Troponi max 0.23).  Assessment/Plan:    Principal Problem:   Cellulitis, severe, in immunosuppressed host / shingles / leukocytosis - apprecaite wound care assessment of the extent of shingle, cellulitis. Area: right breast and back nfectious, in evolution stage of sloughing. Continuous area along right back dermatone measuring 10cm x 39cm x 0.2cm. Open wounds are full thickness with dark wound bed and sloughing epithelium. Dressing changes recommendations: Covering the lesions with a soft silicone foam dressing until sloughing is complete and areas have crusted or reepithelialized completely to reduce pain and minimize scarring. The dressings also promote comfort. - patient is on acyclovir 450 mg IV every 8 hours  - for cellulitis, patient was receiving vancomycin and Zosyn. Since no fever and blood cultures to date are negative we stopped vancomycin and Zosyn 12/29/2014 and started doxycycline 100 mg twice daily IV 12/29/2014. - benadryl for lesions to help with itching   Active Problems:   Rheumatoid arthritis / Immunosuppressed status - patient is on solu-cortef 50 mg IV twice daily; taper down.    Hyponatremia - mild, sodium improved with IV fluids     Elevated troponin I level - likely demand ischemia - no chest pain - 2 D ECHO - EF 60%    Hypertension - Continue propranolol 20 mg twice a day.    Dysphagia - SLP and nutrition consulted  - Patient is on ensure and resource breeze supplementation    Protein-calorie malnutrition, severe - Nutrition consulted    DVT Prophylaxis   Lovenox subQ ordered    Code Status: DNR/DNI  Family Communication:  plan of care discussed with the patient's family at the bedside  Disposition Plan: Home when stable. PT eval pending   IV access:  Peripheral IV  Procedures and diagnostic studies:    No results found.  Medical Consultants:  None   Other Consultants:  Wound care Nutrition SLP  IAnti-Infectives:   Acyclovir 12/25/2014 --> Doxycyline 12/29/2014 --> Vanco 12/25/2014 --> 12/29/2014  Zosyn 12/25/2014 --> 12/29/2014     Mackenzie Passey, MD  [facility] Pager [phone]  If 7PM-7AM, please contact night-coverage [email or web address] Password [facility] 12/30/2014, 12:59 PM   LOS: 7 days    HPI/Subjective: No acute overnight events.  Objective: Filed Vitals:   12/29/14 0998 12/29/14 1349 12/29/14 2100 12/30/14 0657  BP: 124/69 128/50 141/63 144/57  Pulse: 52 60 59 56  Temp: 97.4 F (36.3 C) 97.6 F (36.4 C) 97.3 F (36.3 C) 97.5 F (36.4 C)  TempSrc: Oral Axillary Oral Oral  Resp: 16 16 20 20   Height:      Weight:      SpO2: 95% 96% 97% 99%    Intake/Output Summary (Last 24 hours) at 12/30/14 1259 Last data filed at 12/30/14 0900  Gross per 24 hour  Intake    360 ml  Output    365 ml  Net     -5 ml    Exam:   General:  Pt is more alert this am, no distress  Cardiovascular: Regular rate and rhythm, S1/S2 (+), shingles (+)  Respiratory: no wheezing, no crackles, no rhonchi  Abdomen: Soft, non tender,  non distended, bowel sounds present  Extremities: ecchymosis in UE, pulses DP and PT palpable bilaterally  Neuro: Grossly nonfocal  Data Reviewed: Basic Metabolic Panel:  Recent Labs Lab 12/23/14 1553 12/24/14 0416 12/25/14 0437 12/26/14 0500 12/30/14 0545  NA 132* 126* 129* 134* 141  K 4.4 2.7* 4.1 3.5 2.3*  CL 93* 94* 100 104 108  CO2 25 22 22 22 24   GLUCOSE 117* 121* 128* 120* 106*  BUN 22 19 20 13 11   CREATININE 0.90 0.75 0.66 0.55 0.54  CALCIUM 8.3* 7.4* 7.2* 8.0* 7.6*  MG  --  1.5 1.5  --   --     Liver Function Tests:  Recent Labs Lab 12/23/14 1553  AST 67*  ALT 24  ALKPHOS 93  BILITOT 1.9*  PROT 6.3  ALBUMIN 3.5   No results for input(s): LIPASE, AMYLASE in the last 168 hours. No results for input(s): AMMONIA in the last 168 hours. CBC:  Recent Labs Lab 12/23/14 1553 12/24/14 0416 12/25/14 0437 12/26/14 0500 12/30/14 0545  WBC 7.9 7.9 9.0 10.8* 7.7  NEUTROABS 6.5  --   --   --   --   HGB 15.3* 13.4 12.5 12.8 11.3*  HCT 45.7 40.8 37.3 38.5 34.2*  MCV 95.2 94.9 94.9 94.1 94.2  PLT 133* 114* 116* 123* 163   Cardiac Enzymes:  Recent Labs Lab 12/23/14 1553 12/23/14 2200 12/24/14 0416  TROPONINI 0.10* 0.23* 0.15*   BNP: Invalid input(s): POCBNP CBG: No results for input(s): GLUCAP in the last 168 hours.  Blood culture (routine x 2)     Status: None   Collection Time: 12/23/14  3:53 PM  Result Value Ref Range Status   Specimen Description BLOOD LEFT ARM  Final   Culture   Final    NO GROWTH 5 DAYS   Report Status 12/29/2014 FINAL  Final  Blood culture (routine x 2)     Status: None (Preliminary result)   Collection Time: 12/23/14  8:24 PM  Result Value Ref Range Status   Specimen Description BLOOD R ARM  Final   Special Requests BOTTLES DRAWN AEROBIC ONLY 2CC  Final   Culture   Final           BLOOD CULTURE RECEIVED NO GROWTH TO DATE   Report Status PENDING  Incomplete  Urine culture     Status: None   Collection Time: 12/24/14  3:18 AM  Result Value Ref Range Status   Specimen Description URINE, CATHETERIZED  Final   Special Requests NONE  Final   Culture NO GROWTH  Final   Report Status 12/25/2014 FINAL  Final  Clostridium Difficile by PCR     Status: None   Collection Time: 12/29/14  9:31 PM  Result Value Ref Range Status   C difficile by pcr NEGATIVE NEGATIVE Final    Comment: Performed at [facility]     Scheduled Meds: . acyclovir  450 mg Intravenous 3 times per day  . antiseptic oral rinse  7 mL Mouth Rinse BID  .  aspirin EC  81 mg Oral Daily  . doxycycline (VIBRAMYCIN) IV  100 mg Intravenous Q12H  . enoxaparin (LOVENOX) injection  40 mg Subcutaneous Q24H  . feeding supplement (ENSURE)  1 Container Oral BID WC  . feeding supplement (RESOURCE BREEZE)  1 Container Oral BID BM  . folic acid  1 mg Oral Daily  . hydrocortisone sod succinate (SOLU-CORTEF) inj  50 mg Intravenous Q12H  . oyster calcium  1,250  mg Oral QODAY  . propranolol  20 mg Oral BID  . vitamin B-12  100 mcg Oral Daily   Continuous Infusions: . sodium chloride 50 mL/hr at 12/29/14 1147

## 2014-12-30 NOTE — Progress Notes (Signed)
Foley catheter removed per nurse driven protocol. Patient tolerated procedure well. Will continue to monitor.

## 2014-12-31 LAB — BASIC METABOLIC PANEL
Anion gap: 9 (ref 5–15)
BUN: 17 mg/dL (ref 6–23)
CHLORIDE: 108 meq/L (ref 96–112)
CO2: 23 mmol/L (ref 19–32)
Calcium: 7.8 mg/dL — ABNORMAL LOW (ref 8.4–10.5)
Creatinine, Ser: 0.91 mg/dL (ref 0.50–1.10)
GFR calc Af Amer: 65 mL/min — ABNORMAL LOW (ref >90)
GFR, EST NON AFRICAN AMERICAN: 56 mL/min — AB (ref >90)
GLUCOSE: 124 mg/dL — AB (ref 70–99)
Potassium: 2.6 mmol/L — CL (ref 3.5–5.1)
Sodium: 140 mmol/L (ref 135–145)

## 2014-12-31 MED ORDER — HYDROCORTISONE NA SUCCINATE PF 100 MG IJ SOLR
50.0000 mg | Freq: Every day | INTRAMUSCULAR | Status: DC
  Administered 2014-12-31 – 2015-01-01 (×2): 50 mg via INTRAVENOUS
  Filled 2014-12-31 (×2): qty 1

## 2014-12-31 MED ORDER — POTASSIUM CHLORIDE CRYS ER 20 MEQ PO TBCR
40.0000 meq | EXTENDED_RELEASE_TABLET | ORAL | Status: AC
  Administered 2014-12-31 (×2): 40 meq via ORAL
  Filled 2014-12-31 (×2): qty 2

## 2014-12-31 MED ORDER — DOXYCYCLINE HYCLATE 100 MG PO TABS
100.0000 mg | ORAL_TABLET | Freq: Two times a day (BID) | ORAL | Status: DC
Start: 2014-12-31 — End: 2015-01-01
  Administered 2014-12-31 – 2015-01-01 (×2): 100 mg via ORAL
  Filled 2014-12-31 (×3): qty 1

## 2014-12-31 MED ORDER — ACYCLOVIR 800 MG PO TABS
800.0000 mg | ORAL_TABLET | Freq: Every day | ORAL | Status: DC
  Administered 2015-01-01 (×2): 800 mg via ORAL
  Filled 2014-12-31 (×6): qty 1

## 2014-12-31 MED ORDER — SIMETHICONE 80 MG PO CHEW
80.0000 mg | CHEWABLE_TABLET | Freq: Four times a day (QID) | ORAL | Status: DC | PRN
  Administered 2014-12-31 (×2): 80 mg via ORAL
  Filled 2014-12-31 (×4): qty 1

## 2014-12-31 MED ORDER — DIPHENOXYLATE-ATROPINE 2.5-0.025 MG PO TABS
1.0000 | ORAL_TABLET | Freq: Four times a day (QID) | ORAL | Status: DC | PRN
  Administered 2014-12-31 (×2): 1 via ORAL
  Filled 2014-12-31 (×2): qty 1

## 2014-12-31 NOTE — Progress Notes (Signed)
Physical Therapy Treatment Patient Details Name: Mackenzie Flores MRN: [id number] DOB: Oct 31, 1927 Today's Date: 12/31/2014    History of Present Illness 81 yo female admitted with cellulitis, shingles, weakness. Hx of RA, irregular heartbeat, osteoporosis    PT Comments    Daughter present and very helpful.  Assisted pt OOB with much effort and amb in hallway 2 short distance.  Very unsteady gait.   Poor standing balanceand poor posture of flex hips and knees.  HIGH FALL RISK.  Pt will need ST Rehab at SNF prior to retuning home.  Follow Up Recommendations  SNF     Equipment Recommendations       Recommendations for Other Services       Precautions / Restrictions Precautions Precautions: Fall Restrictions Weight Bearing Restrictions: No    Mobility  Bed Mobility Overal bed mobility: Needs Assistance Bed Mobility: Supine to Sit     Supine to sit: Mod assist;Max assist     General bed mobility comments: Assist for trunk and bil LEs. Increased time. Utilized bedpad for scootingk positioning  Transfers Overall transfer level: Needs assistance Equipment used: Rolling walker (2 wheeled) Transfers: Sit to/from Stand Sit to Stand: Mod assist;Max assist         General transfer comment: 50% VC's on proper hand placement to increase self assist.  Initial posterior LOB. Assist to advance RW.  Assist to complete turns.    Ambulation/Gait Ambulation/Gait assistance: +2 safety/equipment;Max assist Ambulation Distance (Feet): 40 Feet (20 feet x 2 one sitting rest break) Assistive device: Rolling walker (2 wheeled) Gait Pattern/deviations: Step-to pattern;Trunk flexed;Narrow base of support Gait velocity: decreased   General Gait Details: assist with advancing RW and trunk support due to poor balance and difficulty righting self.  Daughter assisted by following with chair.  B hip and knee flexed posture.  R LE weaker than L.  Very unsteady.  HIGH FALL RISK.   Stairs            Wheelchair Mobility    Modified Rankin (Stroke Patients Only)       Balance                                    Cognition                            Exercises      General Comments        Pertinent Vitals/Pain Pain Assessment: No/denies pain    Home Living                      Prior Function            PT Goals (current goals can now be found in the care plan section) Progress towards PT goals: Progressing toward goals    Frequency  Min 3X/week    PT Plan      Co-evaluation             End of Session Equipment Utilized During Treatment: Gait belt Activity Tolerance: Patient limited by fatigue Patient left: in chair;with call bell/phone within reach;with family/visitor present     Time: 6295-2841 PT Time Calculation (min) (ACUTE ONLY): 29 min  Charges:  $Gait Training: 8-22 mins $Therapeutic Activity: 8-22 mins                    G  Codes:      Rica Koyanagi  PTA [facility]  Acute  Rehab Pager      [phone]

## 2014-12-31 NOTE — Clinical Social Work Note (Signed)
   CSW met with daughter at bedside to discuss SNF choice  Family wanted [facility] and [facility] had stated "considering pending nurse eval" so CSW called and spoke with Ivin Booty at [facility] over the weekend  Ivin Booty stated that she approved pt and it was just a formality to have nurse look it over on Monday morning.  Ivin Booty stated that she would have staff call [facility] on Monday to confirm Bed for pt  CSW discussed this with pt's daughter who was happy to hear her mom could go to [facility].  CSW send note through [facility] stating that pt chose them and also attached  recent progress notes  CSW to follow up on Monday with [facility] to secure bed for pt  .Dede Query, LCSW [facility] Clinical Social Worker - Weekend Coverage cell #: [phone]

## 2014-12-31 NOTE — Progress Notes (Signed)
ANTIBIOTIC CONSULT NOTE - Follow-up   Pharmacy Consult for Acyclovir Indication: R chest/back zoster  Allergies  Allergen Reactions  . Codeine Nausea And Vomiting  . Clarithromycin Nausea And Vomiting and Hypertension    Patient Measurements: Height: 5\' 1"  (154.9 cm) Weight: 101 lb 6.6 oz (46 kg) IBW/kg (Calculated) : 47.8  Vital Signs: Temp: 97.6 F (36.4 C) (01/10 0431) Temp Source: Axillary (01/10 0431) BP: 134/63 mmHg (01/10 0431) Pulse Rate: 61 (01/10 0431) Intake/Output from previous day: 01/09 0701 - 01/10 0700 In: 1328 [P.O.:210; I.V.:400; IV Piggyback:718] Out: 1100 [Urine:1100] Intake/Output from this shift:    Labs:  Recent Labs  12/30/14 0545 12/31/14 1206  WBC 7.7  --   HGB 11.3*  --   PLT 163  --   CREATININE 0.54 0.91   Estimated Creatinine Clearance: 32.8 mL/min (by C-G formula based on Cr of 0.91). No results for input(s): VANCOTROUGH, VANCOPEAK, VANCORANDOM, GENTTROUGH, GENTPEAK, GENTRANDOM, TOBRATROUGH, TOBRAPEAK, TOBRARND, AMIKACINPEAK, AMIKACINTROU, AMIKACIN in the last 72 hours.   Microbiology: Recent Results (from the past 720 hour(s))  Blood culture (routine x 2)     Status: None   Collection Time: 12/23/14  3:53 PM  Result Value Ref Range Status   Specimen Description BLOOD LEFT ARM  Final   Special Requests BOTTLES DRAWN AEROBIC ONLY 2CC BLUE BOTTLES  Final   Culture   Final    NO GROWTH 5 DAYS Performed at 02/21/15    Report Status 12/29/2014 FINAL  Final  Blood culture (routine x 2)     Status: None   Collection Time: 12/23/14  8:24 PM  Result Value Ref Range Status   Specimen Description BLOOD R ARM  Final   Special Requests BOTTLES DRAWN AEROBIC ONLY 2CC  Final   Culture   Final    NO GROWTH 5 DAYS Performed at 02/21/15    Report Status 12/30/2014 FINAL  Final  Urine culture     Status: None   Collection Time: 12/24/14  3:18 AM  Result Value Ref Range Status   Specimen Description URINE,  CATHETERIZED  Final   Special Requests NONE  Final   Colony Count NO GROWTH Performed at 02/22/15   Final   Culture NO GROWTH Performed at [facility]   Final   Report Status 12/25/2014 FINAL  Final  Clostridium Difficile by PCR     Status: None   Collection Time: 12/29/14  9:31 PM  Result Value Ref Range Status   C difficile by pcr NEGATIVE NEGATIVE Final    Comment: Performed at [facility]   Assessment: 81 y/o F with PMH of RA, irregular heartbeat, osteoporosis who presents 1/2 with rash, fever, generalized weakness. Patient has had poor appetite, nausea, one episode of vomiting a few days ago, and recent productive cough. Pharmacy consulted to assist with dosing of Vancomycin and Zosyn for severe cellulitis, as well as Acyclovir for shingles.  1/2 >> Ceftriaxone x 1 1/2 >> Valtrex x 1 1/2 >> Vancomycin >> 1/8 1/2 >> Zosyn >> 1/8 1/2 >> Acyclovir (changed to PO 1/10) >>   1/8 >> Doxy (MD) >>  Tmax: 101.4 F on 1/2, afebrile since WBCs: 7.7, improved (on low-dose PTA steroids) Renal: SCr WNL, stable. CrCl normalized 27ml/min (using Scr = 0.8) and 37 CG  1/2 blood x 2: NGF 1/2 urine: NGF  1/10: MD requested acyclovir changed to PO starting 1/11 and recommendation for duration in anticipation of discharge.   Goal  of Therapy:  Appropriate antibiotic dosing for renal function; eradication of infection  Plan:  On 1/11, change acyclovir from 450mg  IV q8h to PO 800mg  5 x a day for 7 days. Monitor renal function, cultures, clinical course.  , PharmD, pager [phone]. 12/31/2014,1:07 PM.

## 2014-12-31 NOTE — Progress Notes (Addendum)
Patient ID: Mackenzie Flores, female   DOB: [birth year], 81 y.o.   MRN: [id number] [facility] PROGRESS NOTE  LILLIONNA NABI TIR:[id number] DOB: Jul 04, 1927 DOA: 12/23/2014 PCP: Michiel Sites, MD  Brief narrative:    81 y.o. female with past medical history of RA on immunosuppressive therapy admitted for shingles, cellulitis . Hospital course complicated with elevated troponin level (Troponi max 0.23).  Assessment/Plan:    Principal Problem:   Cellulitis, severe, in immunosuppressed host / shingles / leukocytosis - apprecaite wound care assessment of the extent of shingle, cellulitis. Area: right breast and back nfectious, in evolution stage of sloughing. Continuous area along right back dermatone measuring 10cm x 39cm x 0.2cm. Open wounds are full thickness with dark wound bed and sloughing epithelium. Dressing changes recommendations: Covering the lesions with a soft silicone foam dressing until sloughing is complete and areas have crusted or reepithelialized completely to reduce pain and minimize scarring. The dressings also promote comfort. - patient is on acyclovir 450 mg IV every 8 hours  - for cellulitis, patient was receiving vancomycin and Zosyn. Since no fever and blood cultures to date are negative we stopped vancomycin and Zosyn 12/29/2014 and started doxycycline 100 mg twice daily IV 12/29/2014. - benadryl for lesions to help with itching   Active Problems:   Rheumatoid arthritis / Immunosuppressed status - patient is on solu-cortef 50 mg IV twice daily. Change to once ad ay starting today.     Hyponatremia - mild, sodium improved with IV fluids  - check BMP today     Elevated troponin I level - likely demand ischemia - no chest pain - 2 D ECHO - EF 60%    Hypokalemia - unclear etiology - supplemented - follow up BMP today     Hypertension - Continue propranolol 20 mg twice a day.    Dysphagia - SLP and nutrition consulted  - Patient is on ensure and resource breeze  supplementation    Protein-calorie malnutrition, severe - Nutrition consulted    DVT Prophylaxis   Lovenox subQ ordered   Code Status: DNR/DNI  Family Communication:  plan of care discussed with the patient's family at the bedside  Disposition Plan: to SNF in next 24 hours   IV access:  Peripheral IV  Procedures and diagnostic studies:    No results found.  Medical Consultants:  None   Other Consultants:  Wound care Nutrition SLP  IAnti-Infectives:   Acyclovir 12/25/2014 --> Doxycyline 12/29/2014 --> Vanco 12/25/2014 --> 12/29/2014  Zosyn 12/25/2014 --> 12/29/2014    Manson Passey, MD  [facility] Pager [phone]  If 7PM-7AM, please contact night-coverage [email or web address] Password [facility] 12/31/2014, 11:42 AM   LOS: 8 days    HPI/Subjective: No acute overnight events.  Objective: Filed Vitals:   12/30/14 0657 12/30/14 1315 12/30/14 1951 12/31/14 0431  BP: 144/57 142/58 144/65 134/63  Pulse: 56 61 70 61  Temp: 97.5 F (36.4 C) 98.7 F (37.1 C) 97.3 F (36.3 C) 97.6 F (36.4 C)  TempSrc: Oral Oral Axillary Axillary  Resp: 20 14 20 18   Height:      Weight:      SpO2: 99% 96% 95% 94%    Intake/Output Summary (Last 24 hours) at 12/31/14 1142 Last data filed at 12/31/14 0436  Gross per 24 hour  Intake   1018 ml  Output   1000 ml  Net     18 ml    Exam:   General:  Pt is awake, no distress  Cardiovascular: Regular  rate and rhythm, S1/S2 (+)  Respiratory: bilateral air entry, no wheezing   Abdomen: non distended, bowel sounds present  Extremities: No edema, pulses DP and PT palpable bilaterally  Neuro: Grossly nonfocal  Data Reviewed: Basic Metabolic Panel:  Recent Labs Lab 12/25/14 0437 12/26/14 0500 12/30/14 0545  NA 129* 134* 141  K 4.1 3.5 2.3*  CL 100 104 108  CO2 22 22 24   GLUCOSE 128* 120* 106*  BUN 20 13 11   CREATININE 0.66 0.55 0.54  CALCIUM 7.2* 8.0* 7.6*  MG 1.5  --   --    Liver Function Tests: No results for  input(s): AST, ALT, ALKPHOS, BILITOT, PROT, ALBUMIN in the last 168 hours. No results for input(s): LIPASE, AMYLASE in the last 168 hours. No results for input(s): AMMONIA in the last 168 hours. CBC:  Recent Labs Lab 12/25/14 0437 12/26/14 0500 12/30/14 0545  WBC 9.0 10.8* 7.7  HGB 12.5 12.8 11.3*  HCT 37.3 38.5 34.2*  MCV 94.9 94.1 94.2  PLT 116* 123* 163   Cardiac Enzymes: No results for input(s): CKTOTAL, CKMB, CKMBINDEX, TROPONINI in the last 168 hours. BNP: Invalid input(s): POCBNP CBG: No results for input(s): GLUCAP in the last 168 hours.  Recent Results (from the past 240 hour(s))  Blood culture (routine x 2)     Status: None   Collection Time: 12/23/14  3:53 PM  Result Value Ref Range Status   Specimen Description BLOOD LEFT ARM  Final   Special Requests BOTTLES DRAWN AEROBIC ONLY 2CC BLUE BOTTLES  Final   Culture   Final    NO GROWTH 5 DAYS Performed at 02/28/15    Report Status 12/29/2014 FINAL  Final  Blood culture (routine x 2)     Status: None   Collection Time: 12/23/14  8:24 PM  Result Value Ref Range Status   Specimen Description BLOOD R ARM  Final   Special Requests BOTTLES DRAWN AEROBIC ONLY 2CC  Final   Culture   Final    NO GROWTH 5 DAYS Performed at 02/27/2015    Report Status 12/30/2014 FINAL  Final  Urine culture     Status: None   Collection Time: 12/24/14  3:18 AM  Result Value Ref Range Status   Specimen Description URINE, CATHETERIZED  Final   Special Requests NONE  Final   Colony Count NO GROWTH Performed at 02/28/2015   Final   Culture NO GROWTH Performed at 02/22/15   Final   Report Status 12/25/2014 FINAL  Final  Clostridium Difficile by PCR     Status: None   Collection Time: 12/29/14  9:31 PM  Result Value Ref Range Status   C difficile by pcr NEGATIVE NEGATIVE Final    Comment: Performed at [facility]     Scheduled Meds: . acyclovir  450 mg Intravenous 3 times per  day  . antiseptic oral rinse  7 mL Mouth Rinse BID  . aspirin EC  81 mg Oral Daily  . doxycycline (VIBRAMYCIN) IV  100 mg Intravenous Q12H  . enoxaparin (LOVENOX) injection  40 mg Subcutaneous Q24H  . feeding supplement (ENSURE)  1 Container Oral BID WC  . feeding supplement (RESOURCE BREEZE)  1 Container Oral BID BM  . folic acid  1 mg Oral Daily  . hydrocortisone sod succinate (SOLU-CORTEF) inj  50 mg Intravenous Q12H  . oyster calcium  1,250 mg Oral QODAY  . propranolol  20 mg Oral BID  .  vitamin B-12  100 mcg Oral Daily   Continuous Infusions: . sodium chloride 50 mL/hr at 12/30/14 1737

## 2014-12-31 NOTE — Progress Notes (Addendum)
PHARMACIST - PHYSICIAN COMMUNICATION DR:   Elisabeth Pigeon CONCERNING: Antibiotic IV to Oral Route Change Policy  RECOMMENDATION: This patient is receiving doxycycline by the intravenous route.  Based on criteria approved by the Pharmacy and Therapeutics Committee, the antibiotic(s) is/are being converted to the equivalent oral dose form(s).   DESCRIPTION: These criteria include:  Patient being treated for a respiratory tract infection, urinary tract infection, cellulitis or clostridium difficile associated diarrhea if on metronidazole  The patient is not neutropenic and does not exhibit a GI malabsorption state  The patient is eating (either orally or via tube) and/or has been taking other orally administered medications for a least 24 hours  The patient is improving clinically and has a Tmax < 100.5  If you have questions about this conversion, please contact the Pharmacy Department  []   [phone] )  ( [phone] []   [phone] )   []   [phone] )  [facility] [x]   [phone] )  [facility]    [facility], PharmD, Pager [phone] 12/31/2014 2:36 PM

## 2014-12-31 NOTE — Progress Notes (Signed)
CRITICAL VALUE ALERT  Critical value received:  K 2.6   Date of notification:  12/31/2014    Time of notification:  1259    Critical value read back:Yes.    Nurse who received alert:  Armanda Heritage   MD notified (1st page):  Dr Elisabeth Pigeon  Time of first page:  12:59 PM   MD notified (2nd page):  Time of second page:  Responding MD:  Dr Elisabeth Pigeon  Time MD responded:  1:04 PM

## 2015-01-01 DIAGNOSIS — B0229 Other postherpetic nervous system involvement: Secondary | ICD-10-CM | POA: Diagnosis not present

## 2015-01-01 DIAGNOSIS — L03818 Cellulitis of other sites: Secondary | ICD-10-CM | POA: Diagnosis not present

## 2015-01-01 DIAGNOSIS — M810 Age-related osteoporosis without current pathological fracture: Secondary | ICD-10-CM | POA: Diagnosis not present

## 2015-01-01 DIAGNOSIS — R4789 Other speech disturbances: Secondary | ICD-10-CM | POA: Diagnosis not present

## 2015-01-01 DIAGNOSIS — R2681 Unsteadiness on feet: Secondary | ICD-10-CM | POA: Diagnosis not present

## 2015-01-01 DIAGNOSIS — R05 Cough: Secondary | ICD-10-CM | POA: Diagnosis not present

## 2015-01-01 DIAGNOSIS — E46 Unspecified protein-calorie malnutrition: Secondary | ICD-10-CM | POA: Diagnosis not present

## 2015-01-01 DIAGNOSIS — B370 Candidal stomatitis: Secondary | ICD-10-CM | POA: Diagnosis not present

## 2015-01-01 DIAGNOSIS — F419 Anxiety disorder, unspecified: Secondary | ICD-10-CM | POA: Diagnosis not present

## 2015-01-01 DIAGNOSIS — R41841 Cognitive communication deficit: Secondary | ICD-10-CM | POA: Diagnosis not present

## 2015-01-01 DIAGNOSIS — E876 Hypokalemia: Secondary | ICD-10-CM | POA: Diagnosis not present

## 2015-01-01 DIAGNOSIS — R4702 Dysphasia: Secondary | ICD-10-CM | POA: Diagnosis not present

## 2015-01-01 DIAGNOSIS — M6281 Muscle weakness (generalized): Secondary | ICD-10-CM | POA: Diagnosis not present

## 2015-01-01 DIAGNOSIS — R338 Other retention of urine: Secondary | ICD-10-CM | POA: Diagnosis not present

## 2015-01-01 DIAGNOSIS — I10 Essential (primary) hypertension: Secondary | ICD-10-CM | POA: Diagnosis not present

## 2015-01-01 DIAGNOSIS — L03313 Cellulitis of chest wall: Secondary | ICD-10-CM | POA: Diagnosis not present

## 2015-01-01 DIAGNOSIS — R5381 Other malaise: Secondary | ICD-10-CM | POA: Diagnosis not present

## 2015-01-01 DIAGNOSIS — B029 Zoster without complications: Secondary | ICD-10-CM | POA: Diagnosis not present

## 2015-01-01 DIAGNOSIS — R609 Edema, unspecified: Secondary | ICD-10-CM | POA: Diagnosis not present

## 2015-01-01 DIAGNOSIS — R1312 Dysphagia, oropharyngeal phase: Secondary | ICD-10-CM | POA: Diagnosis not present

## 2015-01-01 DIAGNOSIS — M069 Rheumatoid arthritis, unspecified: Secondary | ICD-10-CM | POA: Diagnosis not present

## 2015-01-01 DIAGNOSIS — R1310 Dysphagia, unspecified: Secondary | ICD-10-CM | POA: Diagnosis not present

## 2015-01-01 DIAGNOSIS — E43 Unspecified severe protein-calorie malnutrition: Secondary | ICD-10-CM | POA: Diagnosis not present

## 2015-01-01 DIAGNOSIS — R7989 Other specified abnormal findings of blood chemistry: Secondary | ICD-10-CM | POA: Diagnosis not present

## 2015-01-01 DIAGNOSIS — R278 Other lack of coordination: Secondary | ICD-10-CM | POA: Diagnosis not present

## 2015-01-01 LAB — POTASSIUM: POTASSIUM: 3.6 mmol/L (ref 3.5–5.1)

## 2015-01-01 MED ORDER — DOXYCYCLINE HYCLATE 100 MG PO TABS: 100.0000 mg | ORAL_TABLET | Freq: Two times a day (BID) | ORAL | Status: AC

## 2015-01-01 MED ORDER — ACETAMINOPHEN 325 MG PO TABS: 650.0000 mg | ORAL_TABLET | Freq: Four times a day (QID) | ORAL | Status: AC | PRN

## 2015-01-01 MED ORDER — ENSURE PUDDING PO PUDG: 1.0000 | Freq: Two times a day (BID) | ORAL | Status: AC

## 2015-01-01 MED ORDER — PREDNISONE 5 MG PO TABS: 50.0000 mg | ORAL_TABLET | Freq: Every day | ORAL | Status: AC

## 2015-01-01 MED ORDER — DIPHENHYDRAMINE-ZINC ACETATE 2-0.1 % EX CREA: TOPICAL_CREAM | Freq: Three times a day (TID) | CUTANEOUS | Status: AC | PRN

## 2015-01-01 MED ORDER — SIMETHICONE 80 MG PO CHEW: 80.0000 mg | CHEWABLE_TABLET | Freq: Four times a day (QID) | ORAL | Status: AC | PRN

## 2015-01-01 MED ORDER — BOOST / RESOURCE BREEZE PO LIQD: 1.0000 | Freq: Two times a day (BID) | ORAL | Status: AC

## 2015-01-01 MED ORDER — ACYCLOVIR 800 MG PO TABS: 800.0000 mg | ORAL_TABLET | Freq: Every day | ORAL | Status: AC

## 2015-01-01 MED ORDER — CETYLPYRIDINIUM CHLORIDE 0.05 % MT LIQD: 7.0000 mL | Freq: Two times a day (BID) | OROMUCOSAL | Status: AC

## 2015-01-01 MED ORDER — DIAZEPAM 5 MG PO TABS: 5.0000 mg | ORAL_TABLET | Freq: Three times a day (TID) | ORAL | Status: DC

## 2015-01-01 MED ORDER — ALUM & MAG HYDROXIDE-SIMETH 200-200-20 MG/5ML PO SUSP: 30.0000 mL | Freq: Four times a day (QID) | ORAL | Status: AC | PRN

## 2015-01-01 MED ORDER — DOXEPIN HCL 10 MG PO CAPS: 10.0000 mg | ORAL_CAPSULE | Freq: Every evening | ORAL | Status: AC | PRN

## 2015-01-01 MED ORDER — POLYETHYLENE GLYCOL 3350 17 G PO PACK: 17.0000 g | PACK | Freq: Every day | ORAL | Status: AC | PRN

## 2015-01-01 MED ORDER — TRAMADOL HCL 50 MG PO TABS: 50.0000 mg | ORAL_TABLET | Freq: Four times a day (QID) | ORAL | Status: AC | PRN

## 2015-01-01 MED ORDER — FUROSEMIDE 40 MG PO TABS: 40.0000 mg | ORAL_TABLET | Freq: Every day | ORAL | Status: AC

## 2015-01-01 MED ORDER — DIPHENOXYLATE-ATROPINE 2.5-0.025 MG PO TABS: 1.0000 | ORAL_TABLET | Freq: Four times a day (QID) | ORAL | Status: AC | PRN

## 2015-01-01 NOTE — Discharge Summary (Signed)
Physician Discharge Summary  Mackenzie Flores ZOX:[id number] DOB: 1927/08/03 DOA: 12/23/2014  PCP: Michiel Sites, MD  Admit date: 12/23/2014 Discharge date: 01/01/2015  Recommendations for Outpatient Follow-up:  1. Continue taking doxycycline for 7 more days on discharge for cellulitis. 2. Continue taking acyclovir for shingles for additional 7 days on discharge. 3. Continue prednisone taper as prescribed, 50 mg a day, taper down by 5 mg a day until you get to the dose of 5 mg a day. Then you can continue your regular Medrol pack as per prior home regimen. 4. Potassium supplemented prior to discharge. Please check potassium level regularly in the nursing home to make sure it is within normal limits.  Dressing changes recommendations: Area: right breast and back nfectious, in evolution stage of sloughing. Continuous area along right back dermatone measuring 10cm x 39cm x 0.2cm. Open wounds are full thickness with dark wound bed and sloughing epithelium. Dressing changes recommendations: Covering the lesions with a soft silicone foam dressing until sloughing is complete and areas have crusted or reepithelialized completely to reduce pain and minimize scarring. The dressings also promote comfort.   Discharge Diagnoses:  Principal Problem:   Cellulitis, severe, in immunosuppressed host Active Problems:   Shingles outbreak   Immunosuppressed status   Rheumatoid arthritis   Hyponatremia   Elevated troponin I level   Shingles   Hypokalemia   Dysphasia   Protein-calorie malnutrition, severe    Discharge Condition: stable   Diet recommendation: as tolerated   History of present illness:  81 y.o. female with past medical history of RA on immunosuppressive therapy admitted for shingles, cellulitis . Hospital course complicated with elevated troponin level (Troponi max 0.23).  Assessment/Plan:    Principal Problem:  Cellulitis, severe, in immunosuppressed host / shingles /  leukocytosis - apprecaite wound care assessment of the extent of shingle, cellulitis. Area: right breast and back nfectious, in evolution stage of sloughing. Continuous area along right back dermatone measuring 10cm x 39cm x 0.2cm. Open wounds are full thickness with dark wound bed and sloughing epithelium. Dressing changes recommendations: Covering the lesions with a soft silicone foam dressing until sloughing is complete and areas have crusted or reepithelialized completely to reduce pain and minimize scarring. The dressings also promote comfort. - patient is on acyclovir 450 mg IV every 8 hours; changed to PO regimen on discharge for 7 days. - for cellulitis, patient was receiving vancomycin and Zosyn. Since no fever and blood cultures to date are negative we stopped vancomycin and Zosyn 12/29/2014 and started doxycycline 100 mg twice daily IV 12/29/2014. Changed to PO regimen for 7 days on discharge. - benadryl for lesions to help with itching   Active Problems:  Rheumatoid arthritis / Immunosuppressed status - patient is on solu-cortef 50 mg IV twice daily.  - Tapered Solu-Cortef to once a day regimen. Patient can continue prednisone taper as indicated above, 50 mg a day, taper down by 5 mg a day all the way down to 5 mg and once patient takes that a dose she may continue her regular prednisone as before.   Hyponatremia - mild, sodium improved with IV fluids    Elevated troponin I level - likely demand ischemia - no chest pain - 2 D ECHO - EF 60%   Hypokalemia - Patient is on Lasix. We will resume Lasix on discharge. Potassium rechecked prior to discharge and supplemented. - Patient will take potassium supplementation as before.   Hypertension - Continue propranolol 20 mg twice a day.  Dysphagia - SLP and nutrition consulted  - Patient is on ensure and resource breeze supplementation   Protein-calorie malnutrition, severe - Nutrition consulted    DVT Prophylaxis    Lovenox subQ ordered but patient is in hospital.  Code Status: DNR/DNI  Family Communication: plan of care discussed with the patient's family at the bedside    IV access:  Peripheral IV  Procedures and diagnostic studies:   No results found.  Medical Consultants:  None   Other Consultants:  Wound care Nutrition SLP  IAnti-Infectives:   Acyclovir 12/25/2014 --> Doxycyline 12/29/2014 --> Vanco 12/25/2014 --> 12/29/2014  Zosyn 12/25/2014 --> 12/29/2014   Signed:  Manson Passey, MD  [facility] 01/01/2015, 10:30 AM  Pager #: [phone] She is similarly speaking out cancer  Discharge Exam: Filed Vitals:   01/01/15 0541  BP: 132/59  Pulse: 63  Temp: 98.1 F (36.7 C)  Resp: 16   Filed Vitals:   12/31/14 0431 12/31/14 1410 12/31/14 2032 01/01/15 0541  BP: 134/63 130/50 129/56 132/59  Pulse: 61 63 77 63  Temp: 97.6 F (36.4 C) 98.1 F (36.7 C) 97.4 F (36.3 C) 98.1 F (36.7 C)  TempSrc: Axillary Oral Oral Oral  Resp: 18 14 16 16   Height:      Weight:      SpO2: 94% 96% 100% 96%    General: Pt is alert, follows commands appropriately, not in acute distress Cardiovascular: Regular rate and rhythm, S1/S2 +, no murmurs Respiratory: Clear to auscultation bilaterally, no wheezing, no crackles, no rhonchi Abdominal: Soft, non tender, non distended, bowel sounds +, no guarding Extremities: Ecchymosis improving in upper extremities, has shingle lesions, no cyanosis, pulses palpable bilaterally DP and PT Neuro: Grossly nonfocal  Discharge Instructions  Discharge Instructions    Call MD for:  difficulty breathing, headache or visual disturbances    Complete by:  As directed      Call MD for:  persistant nausea and vomiting    Complete by:  As directed      Call MD for:  redness, tenderness, or signs of infection (pain, swelling, redness, odor or green/yellow discharge around incision site)    Complete by:  As directed      Call MD for:   severe uncontrolled pain    Complete by:  As directed      Diet - low sodium heart healthy    Complete by:  As directed      Discharge instructions    Complete by:  As directed   1. Continue taking doxycycline for 7 more days on discharge for cellulitis. 2. Continue taking acyclovir for shingles for additional 7 days on discharge. 3. Continue prednisone taper as prescribed, 50 mg a day, taper down by 5 mg a day until you get to the dose of 5 mg a day. Then you can continue your regular Medrol pack as per prior home regimen. 4. Potassium supplemented prior to discharge. Please check potassium level regularly in the nursing home to make sure it is within normal limits.     Increase activity slowly    Complete by:  As directed             Medication List    TAKE these medications        acetaminophen 325 MG tablet  Commonly known as:  TYLENOL  Take 2 tablets (650 mg total) by mouth every 6 (six) hours as needed for mild pain (or Fever >/= 101).     acyclovir  800 MG tablet  Commonly known as:  ZOVIRAX  Take 1 tablet (800 mg total) by mouth 5 (five) times daily.     alum & mag hydroxide-simeth 200-200-20 MG/5ML suspension  Commonly known as:  MAALOX/MYLANTA  Take 30 mLs by mouth every 6 (six) hours as needed for indigestion or heartburn (dyspepsia).     antiseptic oral rinse 0.05 % Liqd solution  Commonly known as:  CPC / CETYLPYRIDINIUM CHLORIDE 0.05%  7 mLs by Mouth Rinse route 2 (two) times daily.     aspirin EC 81 MG tablet  Take 81 mg by mouth daily.     calcium carbonate 600 MG Tabs tablet  Commonly known as:  OS-CAL  Take 600 mg by mouth every other day.     diazepam 5 MG tablet  Commonly known as:  VALIUM  Take 1 tablet (5 mg total) by mouth 3 (three) times daily.     diphenhydrAMINE-zinc acetate cream  Commonly known as:  BENADRYL  Apply topically 3 (three) times daily as needed for itching.     diphenoxylate-atropine 2.5-0.025 MG per tablet  Commonly known as:   LOMOTIL  Take 1 tablet by mouth 4 (four) times daily as needed for diarrhea or loose stools.     doxepin 10 MG capsule  Commonly known as:  SINEQUAN  Take 1 capsule (10 mg total) by mouth at bedtime as needed (Itching.).     doxycycline 100 MG tablet  Commonly known as:  VIBRA-TABS  Take 1 tablet (100 mg total) by mouth every 12 (twelve) hours.     feeding supplement (ENSURE) Pudg  Take 1 Container by mouth 2 (two) times daily with a meal.     feeding supplement (RESOURCE BREEZE) Liqd  Take 1 Container by mouth 2 (two) times daily between meals.     folic acid 1 MG tablet  Commonly known as:  FOLVITE  Take 1 mg by mouth daily.     furosemide 40 MG tablet  Commonly known as:  LASIX  Take 1 tablet (40 mg total) by mouth daily.     hydroxychloroquine 200 MG tablet  Commonly known as:  PLAQUENIL  Take 200 mg by mouth at bedtime.     KENALOG IJ  Inject 1 each as directed once a week. Got in right knee at dr's office--Wed     methylPREDNISolone 4 MG tablet  Commonly known as:  MEDROL  Take 4 mg by mouth at bedtime.     polyethylene glycol packet  Commonly known as:  MIRALAX / GLYCOLAX  Take 17 g by mouth daily as needed for mild constipation.     potassium chloride 10 MEQ tablet  Commonly known as:  K-DUR,KLOR-CON  Take 20 mEq by mouth daily.     predniSONE 5 MG tablet  Commonly known as:  DELTASONE  Take 10 tablets (50 mg total) by mouth daily with breakfast.     propranolol 20 MG tablet  Commonly known as:  INDERAL  Take 20 mg by mouth 2 (two) times daily.     simethicone 80 MG chewable tablet  Commonly known as:  MYLICON  Chew 1 tablet (80 mg total) by mouth every 6 (six) hours as needed for flatulence.     traMADol 50 MG tablet  Commonly known as:  ULTRAM  Take 1 tablet (50 mg total) by mouth every 6 (six) hours as needed for moderate pain.     VITAMIN B 12 PO  Take 1 tablet by mouth daily.  Follow-up Information    Follow up with  Michiel Sites, MD. Schedule an appointment as soon as possible for a visit in 1 week.   Specialty:  Endocrinology   Why:  Follow up appt after recent hospitalization   Contact information:   [address] [place] [address] [phone]        The results of significant diagnostics from this hospitalization (including imaging, microbiology, ancillary and laboratory) are listed below for reference.    Significant Diagnostic Studies: Dg Chest 2 View  12/23/2014   CLINICAL DATA:  Fever, weakness.  EXAM: CHEST  2 VIEW  COMPARISON:  September 09, 2010.  FINDINGS: The heart size and mediastinal contours are within normal limits. Both lungs are clear. Stable hiatal hernia. No pneumothorax or pleural effusion is noted. Mild thoracic kyphosis is noted.  IMPRESSION: No acute cardiopulmonary abnormality seen.   Electronically Signed   By: Roque Lias M.D.   On: 12/23/2014 16:47    Microbiology: Recent Results (from the past 240 hour(s))  Blood culture (routine x 2)     Status: None   Collection Time: 12/23/14  3:53 PM  Result Value Ref Range Status   Specimen Description BLOOD LEFT ARM  Final   Special Requests BOTTLES DRAWN AEROBIC ONLY 2CC BLUE BOTTLES  Final   Culture   Final    NO GROWTH 5 DAYS Performed at [facility]    Report Status 12/29/2014 FINAL  Final  Blood culture (routine x 2)     Status: None   Collection Time: 12/23/14  8:24 PM  Result Value Ref Range Status   Specimen Description BLOOD R ARM  Final   Special Requests BOTTLES DRAWN AEROBIC ONLY 2CC  Final   Culture   Final    NO GROWTH 5 DAYS Performed at [facility]    Report Status 12/30/2014 FINAL  Final  Urine culture     Status: None   Collection Time: 12/24/14  3:18 AM  Result Value Ref Range Status   Specimen Description URINE, CATHETERIZED  Final   Special Requests NONE  Final   Colony Count NO GROWTH Performed at [facility]   Final   Culture NO  GROWTH Performed at [facility]   Final   Report Status 12/25/2014 FINAL  Final  Clostridium Difficile by PCR     Status: None   Collection Time: 12/29/14  9:31 PM  Result Value Ref Range Status   C difficile by pcr NEGATIVE NEGATIVE Final    Comment: Performed at [facility]     Labs: Basic Metabolic Panel:  Recent Labs Lab 12/26/14 0500 12/30/14 0545 12/31/14 1206  NA 134* 141 140  K 3.5 2.3* 2.6*  CL 104 108 108  CO2 22 24 23   GLUCOSE 120* 106* 124*  BUN 13 11 17   CREATININE 0.55 0.54 0.91  CALCIUM 8.0* 7.6* 7.8*   Liver Function Tests: No results for input(s): AST, ALT, ALKPHOS, BILITOT, PROT, ALBUMIN in the last 168 hours. No results for input(s): LIPASE, AMYLASE in the last 168 hours. No results for input(s): AMMONIA in the last 168 hours. CBC:  Recent Labs Lab 12/26/14 0500 12/30/14 0545  WBC 10.8* 7.7  HGB 12.8 11.3*  HCT 38.5 34.2*  MCV 94.1 94.2  PLT 123* 163   Cardiac Enzymes: No results for input(s): CKTOTAL, CKMB, CKMBINDEX, TROPONINI in the last 168 hours. BNP: BNP (last 3 results) No results for input(s): PROBNP in the last  8760 hours. CBG: No results for input(s): GLUCAP in the last 168 hours.  Time coordinating discharge: Over 30 minutes

## 2015-01-01 NOTE — Discharge Instructions (Signed)

## 2015-01-01 NOTE — Care Management (Signed)
Medicare Important Message given?  YES (If response is "NO", the following Medicare IM given date fields will be blank) Date Medicare IM given:  12/29/2014 Medicare IM given by:  Sandford Craze Date Additional Medicare IM given:  01/01/2015 Additional Medicare IM given by:  Arna Medici Rhya Shan

## 2015-01-01 NOTE — Progress Notes (Signed)
Pt for discharge to [facility].   CSW facilitated pt discharge needs including contacting facility, faxing pt discharge information via [facility], discussing with pt and pt daughter at bedside, providing RN phone number to call report, and arranging ambulance transport via [facility] for pt to [facility].   Pt and pt daughter pleased that [facility] is going to be able to meet pt needs and eager for pt to get to [facility] for pt to start her rehab.   No further social work needs identified at this time.  CSW signing off.   Loletta Specter, MSW, LCSW Clinical Social Work [phone]

## 2015-01-01 NOTE — Progress Notes (Signed)
Patient discharged to SNF, copies of all discharge medications and instructions sent to facility. Patient to be transported via [facility].  

## 2015-01-02 ENCOUNTER — Non-Acute Institutional Stay (SKILLED_NURSING_FACILITY): Payer: Medicare Other | Admitting: Adult Health

## 2015-01-02 ENCOUNTER — Encounter: Payer: Self-pay | Admitting: Adult Health

## 2015-01-02 DIAGNOSIS — B029 Zoster without complications: Secondary | ICD-10-CM

## 2015-01-02 DIAGNOSIS — E876 Hypokalemia: Secondary | ICD-10-CM

## 2015-01-02 DIAGNOSIS — R5381 Other malaise: Secondary | ICD-10-CM

## 2015-01-02 DIAGNOSIS — F419 Anxiety disorder, unspecified: Secondary | ICD-10-CM | POA: Insufficient documentation

## 2015-01-02 DIAGNOSIS — L03313 Cellulitis of chest wall: Secondary | ICD-10-CM

## 2015-01-02 DIAGNOSIS — M069 Rheumatoid arthritis, unspecified: Secondary | ICD-10-CM

## 2015-01-02 DIAGNOSIS — E43 Unspecified severe protein-calorie malnutrition: Secondary | ICD-10-CM

## 2015-01-02 DIAGNOSIS — I10 Essential (primary) hypertension: Secondary | ICD-10-CM

## 2015-01-02 NOTE — Progress Notes (Signed)
Patient ID: Mackenzie Flores, female   DOB: 06/21/1927, 81 y.o.   MRN: [id number]   01/02/2015  Facility:  Nursing Home Location:  [facility] Room Number: [id number] LEVEL OF CARE:  SNF (31)   Chief Complaint  Patient presents with  . Hospitalization Follow-up    Shingles, cellulitis, rheumatoid arthritis, hypokalemia, hypertension and protein calorie malnutrition     HISTORY OF PRESENT ILLNESS:  This is an 81 year old female who was been admitted to [facility] on 01/01/15 from [facility]. She has history of rheumatoid arthritis and on suppressive therapy and hypertension. She has been admitted to the hospital with shingles and cellulitis. Patient is currently on Acyclovir for shingles and doxycycline for cellulitis. She has been admitted for a short-term rehabilitation.  PAST MEDICAL HISTORY:  Past Medical History  Diagnosis Date  . Rheumatoid arthritis   . Irregular heartbeat   . Osteoporosis     CURRENT MEDICATIONS: Reviewed per MAR/see medication list  Allergies  Allergen Reactions  . Codeine Nausea And Vomiting  . Clarithromycin Nausea And Vomiting and Hypertension     REVIEW OF SYSTEMS:  GENERAL: no change in appetite, no fatigue, no weight changes, no fever, chills or weakness RESPIRATORY: no cough, SOB, DOE, wheezing, hemoptysis CARDIAC: no chest pain, edema or palpitations GI: no abdominal pain, diarrhea, constipation, heart burn, nausea or vomiting  PHYSICAL EXAMINATION  GENERAL: no acute distress, normal body habitus SKIN:  Right breast/under and right upper back with redness and crusting EYES: conjunctivae normal, sclerae normal, normal eye lids NECK: supple, trachea midline, no neck masses, no thyroid tenderness, no thyromegaly LYMPHATICS: no LAN in the neck, no supraclavicular LAN RESPIRATORY: breathing is even & unlabored, BS CTAB CARDIAC: RRR, no murmur,no extra heart sounds, no edema GI: abdomen soft, normal BS, no  masses, no tenderness, no hepatomegaly, no splenomegaly EXTREMITIES: Able to move all 4 extremities PSYCHIATRIC: the patient is alert & oriented to person, affect & behavior appropriate  LABS/RADIOLOGY: Labs reviewed: Basic Metabolic Panel:  Recent Labs  46/50/35 0416 12/25/14 0437 12/26/14 0500 12/30/14 0545 12/31/14 1206 01/01/15 1055  NA 126* 129* 134* 141 140  --   K 2.7* 4.1 3.5 2.3* 2.6* 3.6  CL 94* 100 104 108 108  --   CO2 22 22 22 24 23   --   GLUCOSE 121* 128* 120* 106* 124*  --   BUN 19 20 13 11 17   --   CREATININE 0.75 0.66 0.55 0.54 0.91  --   CALCIUM 7.4* 7.2* 8.0* 7.6* 7.8*  --   MG 1.5 1.5  --   --   --   --    Liver Function Tests:  Recent Labs  12/23/14 1553  AST 67*  ALT 24  ALKPHOS 93  BILITOT 1.9*  PROT 6.3  ALBUMIN 3.5   CBC:  Recent Labs  12/23/14 1553  12/25/14 0437 12/26/14 0500 12/30/14 0545  WBC 7.9  < > 9.0 10.8* 7.7  NEUTROABS 6.5  --   --   --   --   HGB 15.3*  < > 12.5 12.8 11.3*  HCT 45.7  < > 37.3 38.5 34.2*  MCV 95.2  < > 94.9 94.1 94.2  PLT 133*  < > 116* 123* 163  < > = values in this interval not displayed.   Cardiac Enzymes:  Recent Labs  12/23/14 1553 12/23/14 2200 12/24/14 0416  TROPONINI 0.10* 0.23* 0.15*    Dg Chest 2 View  12/23/2014   CLINICAL DATA:  Fever, weakness.  EXAM: CHEST  2 VIEW  COMPARISON:  September 09, 2010.  FINDINGS: The heart size and mediastinal contours are within normal limits. Both lungs are clear. Stable hiatal hernia. No pneumothorax or pleural effusion is noted. Mild thoracic kyphosis is noted.  IMPRESSION: No acute cardiopulmonary abnormality seen.   Electronically Signed   By: Roque Lias M.D.   On: 12/23/2014 16:47    ASSESSMENT/PLAN:  Physical deconditioning - for rehabilitation Shingles - continue Acyclovir 7 days Cellulitis - continue doxycycline 7 days Rheumatoid arthritis - continue prednisone taper from 50 mg to 5 mg then discontinue and continue Medrol 4 mg by mouth  daily at bedtime after prednisone has been discontinued Hypokalemia - continue potassium supplementation Severe protein calorie malnutrition - continue supplementation   Goals of care:  Short-term rehabilitation    Labs/test ordered:  none   Spent 50 minutes in patient care.     [facility], NP [employer] [phone]

## 2015-01-03 ENCOUNTER — Non-Acute Institutional Stay (SKILLED_NURSING_FACILITY): Payer: Medicare Other | Admitting: Internal Medicine

## 2015-01-03 DIAGNOSIS — L03818 Cellulitis of other sites: Secondary | ICD-10-CM | POA: Diagnosis not present

## 2015-01-03 DIAGNOSIS — E46 Unspecified protein-calorie malnutrition: Secondary | ICD-10-CM

## 2015-01-03 DIAGNOSIS — E876 Hypokalemia: Secondary | ICD-10-CM

## 2015-01-03 DIAGNOSIS — R609 Edema, unspecified: Secondary | ICD-10-CM

## 2015-01-03 DIAGNOSIS — R5381 Other malaise: Secondary | ICD-10-CM

## 2015-01-03 DIAGNOSIS — F5105 Insomnia due to other mental disorder: Secondary | ICD-10-CM

## 2015-01-03 DIAGNOSIS — M069 Rheumatoid arthritis, unspecified: Secondary | ICD-10-CM

## 2015-01-03 DIAGNOSIS — B0229 Other postherpetic nervous system involvement: Secondary | ICD-10-CM

## 2015-01-03 DIAGNOSIS — F419 Anxiety disorder, unspecified: Secondary | ICD-10-CM

## 2015-01-03 DIAGNOSIS — B0223 Postherpetic polyneuropathy: Secondary | ICD-10-CM

## 2015-01-03 DIAGNOSIS — B029 Zoster without complications: Secondary | ICD-10-CM | POA: Diagnosis not present

## 2015-01-03 NOTE — Progress Notes (Signed)
Patient ID: Mackenzie Flores, female   DOB: 1927-10-29, 81 y.o.   MRN: [id number]     [facility]   PCP: Michiel Sites, MD  Code Status: DNR  Allergies  Allergen Reactions  . Codeine Nausea And Vomiting  . Clarithromycin Nausea And Vomiting and Hypertension    Chief Complaint  Patient presents with  . New Admit To SNF     HPI:  81 year old patient is here for short term rehabilitation post hospital admission from 12/23/14-01/01/15 with shingles and cellulitis. She was started on Acyclovir for shingles and doxycycline for cellulitis.  She has past medical history of rheumatoid arthritis, hypertension.  She is seen in her room today. She complaints of increased leg swelling and mentions that she was taking lasix 40 mg twice a day at home. She also mentions that valium helps her relax and sleep and would like to be sure she is getting it in the facility. Her daughter is present in the room. She also complaints of pain in the lesion area in her back and chest. She is tired post therapy. Denies any other concerns.  Review of Systems:  Constitutional: Positive for easy fatigue. Negative for fever, chills, diaphoresis.  HENT: Negative for headache, congestion Eyes: Negative for eye pain, blurred vision, double vision and discharge.  Respiratory: Negative for cough, shortness of breath and wheezing.   Cardiovascular: Negative for chest pain, palpitations Gastrointestinal: Negative for heartburn, nausea, vomiting, abdominal pain Genitourinary: Negative for dysuria and flank pain.  Musculoskeletal: Negative for back pain, falls Skin: Negative for itching.   Neurological: Positive for weakness. Negative for dizziness, tingling, focal weakness Psychiatric/Behavioral: Negative for depression.    Past Medical History  Diagnosis Date  . Rheumatoid arthritis   . Irregular heartbeat   . Osteoporosis    Past Surgical History  Procedure Laterality Date  .  Appendectomy    . Abdominal hysterectomy    . Cataract surgery     Social History:   reports that she has never smoked. She has never used smokeless tobacco. She reports that she does not drink alcohol. Her drug history is not on file.  Family History  Problem Relation Age of Onset  . Cancer Sister     Brain cancer  . Cancer Brother     Lung cancer  . Stroke Father   . Heart attack Mother     Medications: Patient's Medications  New Prescriptions   No medications on file  Previous Medications   ACETAMINOPHEN (TYLENOL) 325 MG TABLET    Take 2 tablets (650 mg total) by mouth every 6 (six) hours as needed for mild pain (or Fever >/= 101).   ACYCLOVIR (ZOVIRAX) 800 MG TABLET    Take 1 tablet (800 mg total) by mouth 5 (five) times daily.   ALUM & MAG HYDROXIDE-SIMETH (MAALOX/MYLANTA) 200-200-20 MG/5ML SUSPENSION    Take 30 mLs by mouth every 6 (six) hours as needed for indigestion or heartburn (dyspepsia).   ANTISEPTIC ORAL RINSE (CPC / CETYLPYRIDINIUM CHLORIDE 0.05%) 0.05 % LIQD SOLUTION    7 mLs by Mouth Rinse route 2 (two) times daily.   ASPIRIN EC 81 MG TABLET    Take 81 mg by mouth daily.   CALCIUM CARBONATE (OS-CAL) 600 MG TABS TABLET    Take 600 mg by mouth every other day.   CYANOCOBALAMIN (VITAMIN B 12 PO)    Take 1 tablet by mouth daily.   DIAZEPAM (VALIUM) 5 MG TABLET  Take 1 tablet (5 mg total) by mouth 3 (three) times daily.   DIPHENHYDRAMINE-ZINC ACETATE (BENADRYL) CREAM    Apply topically 3 (three) times daily as needed for itching.   DIPHENOXYLATE-ATROPINE (LOMOTIL) 2.5-0.025 MG PER TABLET    Take 1 tablet by mouth 4 (four) times daily as needed for diarrhea or loose stools.   DOXEPIN (SINEQUAN) 10 MG CAPSULE    Take 1 capsule (10 mg total) by mouth at bedtime as needed (Itching.).   DOXYCYCLINE (VIBRA-TABS) 100 MG TABLET    Take 1 tablet (100 mg total) by mouth every 12 (twelve) hours.   FEEDING SUPPLEMENT, ENSURE, (ENSURE) PUDG    Take 1 Container by mouth 2 (two)  times daily with a meal.   FEEDING SUPPLEMENT, RESOURCE BREEZE, (RESOURCE BREEZE) LIQD    Take 1 Container by mouth 2 (two) times daily between meals.   FOLIC ACID (FOLVITE) 1 MG TABLET    Take 1 mg by mouth daily.   FUROSEMIDE (LASIX) 40 MG TABLET    Take 1 tablet (40 mg total) by mouth daily.   HYDROXYCHLOROQUINE (PLAQUENIL) 200 MG TABLET    Take 200 mg by mouth at bedtime.    METHYLPREDNISOLONE (MEDROL) 4 MG TABLET    Take 4 mg by mouth at bedtime.    POLYETHYLENE GLYCOL (MIRALAX / GLYCOLAX) PACKET    Take 17 g by mouth daily as needed for mild constipation.   POTASSIUM CHLORIDE (K-DUR,KLOR-CON) 10 MEQ TABLET    Take 20 mEq by mouth daily.   PREDNISONE (DELTASONE) 5 MG TABLET    Take 10 tablets (50 mg total) by mouth daily with breakfast.   PROPRANOLOL (INDERAL) 20 MG TABLET    Take 20 mg by mouth 2 (two) times daily.   SIMETHICONE (MYLICON) 80 MG CHEWABLE TABLET    Chew 1 tablet (80 mg total) by mouth every 6 (six) hours as needed for flatulence.   TRAMADOL (ULTRAM) 50 MG TABLET    Take 1 tablet (50 mg total) by mouth every 6 (six) hours as needed for moderate pain.   TRIAMCINOLONE ACETONIDE (KENALOG IJ)    Inject 1 each as directed once a week. Got in right knee at dr's office--Wed  Modified Medications   No medications on file  Discontinued Medications   No medications on file     Physical Exam: Filed Vitals:   01/03/15 1706  BP: 134/60  Pulse: 70  Temp: 99.3 F (37.4 C)  Resp: 16  SpO2: 94%    General- elderly female, thin built and frailt, in no acute distress Head- normocephalic, atraumatic Throat- moist mucus membrane Neck- no cervical lymphadenopathy Cardiovascular- normal s1,s2, no murmurs, 2+ edema in right leg and 1+ in left leg, patient mentions left > right chronically  Respiratory- bilateral clear to auscultation, no wheeze, no rhonchi, no crackles, no use of accessory muscles Abdomen- bowel sounds present, soft, non tender Musculoskeletal- able to move all 4  extremities, generalized weakness, trace edema noted in both hands as well Neurological- no focal deficit Skin- warm and dry, left shin skin tear, open wound with some black lesion and other sloughing lesion in right back, outer quadrant of right breast and in chest (sternal area), very few crusted lesions but most are open with slough, no drainage or infection and has dressing on it, mild erythema Psychiatry- alert and oriented to person, place and time, normal mood and affect    Labs reviewed: Basic Metabolic Panel:  Recent Labs  74/16/38 0416 12/25/14 0437 12/26/14  0500 12/30/14 0545 12/31/14 1206 01/01/15 1055  NA 126* 129* 134* 141 140  --   K 2.7* 4.1 3.5 2.3* 2.6* 3.6  CL 94* 100 104 108 108  --   CO2 22 22 22 24 23   --   GLUCOSE 121* 128* 120* 106* 124*  --   BUN 19 20 13 11 17   --   CREATININE 0.75 0.66 0.55 0.54 0.91  --   CALCIUM 7.4* 7.2* 8.0* 7.6* 7.8*  --   MG 1.5 1.5  --   --   --   --    Liver Function Tests:  Recent Labs  12/23/14 1553  AST 67*  ALT 24  ALKPHOS 93  BILITOT 1.9*  PROT 6.3  ALBUMIN 3.5   No results for input(s): LIPASE, AMYLASE in the last 8760 hours. No results for input(s): AMMONIA in the last 8760 hours. CBC:  Recent Labs  12/23/14 1553  12/25/14 0437 12/26/14 0500 12/30/14 0545  WBC 7.9  < > 9.0 10.8* 7.7  NEUTROABS 6.5  --   --   --   --   HGB 15.3*  < > 12.5 12.8 11.3*  HCT 45.7  < > 37.3 38.5 34.2*  MCV 95.2  < > 94.9 94.1 94.2  PLT 133*  < > 116* 123* 163  < > = values in this interval not displayed. Cardiac Enzymes:  Recent Labs  12/23/14 1553 12/23/14 2200 12/24/14 0416  TROPONINI 0.10* 0.23* 0.15*   Assessment/Plan  Physical deconditioning Will have her work with physical therapy and occupational therapy team to help with gait training and muscle strengthening exercises.fall precautions. Skin care. Encourage to be out of bed.   Shingles continue Acyclovir and prednisone tapering course. Monitor  clinically. continue silicone foam dressing and keep lesions covered, pt on droplet precautions.   Neuropathic pain From her shingles lesion. Start gabapentin 100 mg tid and reassess for pain  Cellulitis Continue and complete doxycycline course on 01/07/15, cellulitis improving  Rheumatoid arthritis  Will need to start medrol post completion of tapering course of prednisone. Continue plaquenil  Severe protein calorie malnutrition  On medpass supplement. Continue this and wound care  Insomnia with anxiety Already on valium 5 mg tid, continue this  Edema Change lasix to 40 mg bid from daily, monitor bmp  Hypokalemia Continue kcl supplement   Goals of care: short term rehabilitation   Labs/tests ordered- cbc with diff, cmp  Family/ staff Communication: reviewed care plan with patient and nursing supervisor    02/22/15, MD  [facility] [phone] (Monday-Friday 8 am - 5 pm) [phone] (afterhours)

## 2015-01-10 ENCOUNTER — Other Ambulatory Visit: Payer: Self-pay | Admitting: *Deleted

## 2015-01-10 MED ORDER — DIAZEPAM 5 MG PO TABS: ORAL_TABLET | ORAL | Status: AC

## 2015-01-10 NOTE — Telephone Encounter (Signed)
[facility] 

## 2015-01-11 ENCOUNTER — Encounter: Payer: Self-pay | Admitting: Adult Health

## 2015-01-11 ENCOUNTER — Non-Acute Institutional Stay (SKILLED_NURSING_FACILITY): Payer: Medicare Other | Admitting: Adult Health

## 2015-01-11 DIAGNOSIS — R609 Edema, unspecified: Secondary | ICD-10-CM | POA: Diagnosis not present

## 2015-01-11 DIAGNOSIS — F419 Anxiety disorder, unspecified: Secondary | ICD-10-CM | POA: Diagnosis not present

## 2015-01-11 DIAGNOSIS — E46 Unspecified protein-calorie malnutrition: Secondary | ICD-10-CM

## 2015-01-11 DIAGNOSIS — E876 Hypokalemia: Secondary | ICD-10-CM | POA: Diagnosis not present

## 2015-01-11 DIAGNOSIS — B370 Candidal stomatitis: Secondary | ICD-10-CM

## 2015-01-11 NOTE — Progress Notes (Signed)
Patient ID: Mackenzie Flores, female   DOB: 07/22/1927, 81 y.o.   MRN: [id number]   01/11/2015  Facility:  Nursing Home Location:  [facility] Room Number: 908-P LEVEL OF CARE:  SNF (31)  Chief Complaint  Patient presents with  . Acute Visit    Hypokalemia, oral thrush, edema, anxiety and protein calorie malnutrition    HISTORY OF PRESENT ILLNESS:  This is an 81 year old female who is currently having a short-term rehabilitation at [facility] rehabilitation. Potassium 2.7 -low. Patient has poor appetite and on Lasix for edema. Noted to have whitish cheesy coating on her tongue and reports having burning sensation in her mouth whenever she eats. Patient noted to be sleepy but alert and oriented. Patient requested for her Valium to be given only at bedtime.  PAST MEDICAL HISTORY:  Past Medical History  Diagnosis Date  . Rheumatoid arthritis   . Irregular heartbeat   . Osteoporosis     CURRENT MEDICATIONS: Reviewed per MAR/see medication list  Allergies  Allergen Reactions  . Codeine Nausea And Vomiting  . Clarithromycin Nausea And Vomiting and Hypertension     REVIEW OF SYSTEMS:  GENERAL: no fever, chills or weakness RESPIRATORY: no cough, SOB, DOE, wheezing, hemoptysis CARDIAC: no chest pain, or palpitations, +edema GI: no abdominal pain, diarrhea, constipation, heart burn, nausea or vomiting  PHYSICAL EXAMINATION  GENERAL: no acute distress, normal body habitus SKIN:  Right breast/under and right upper back with redness and crusting MOUTH:  whitish cheesy coating on tongue NECK: supple, trachea midline, no neck masses, no thyroid tenderness, no thyromegaly LYMPHATICS: no LAN in the neck, no supraclavicular LAN RESPIRATORY: breathing is even & unlabored, BS CTAB CARDIAC: RRR, no murmur,no extra heart sounds, BLE edema 2+ GI: abdomen soft, normal BS, no masses, no tenderness, no hepatomegaly, no splenomegaly EXTREMITIES: Able to move all 4  extremities PSYCHIATRIC: the patient is alert & oriented to person, affect & behavior appropriate  LABS/RADIOLOGY: 01/08/15  WBC 14.7 hemoglobin 12.4 hematocrit 36.7 MCV 94.6 sodium 135 potassium 2.7 glucose 81 BUN 23 creatinine 0.8 calcium 8.1 total protein 5.1 albumin 2.7 ALP 59 AST 17 ALT 18 GFR>60 Labs reviewed: Basic Metabolic Panel:  Recent Labs  31/49/70 0416 12/25/14 0437 12/26/14 0500 12/30/14 0545 12/31/14 1206 01/01/15 1055  NA 126* 129* 134* 141 140  --   K 2.7* 4.1 3.5 2.3* 2.6* 3.6  CL 94* 100 104 108 108  --   CO2 22 22 22 24 23   --   GLUCOSE 121* 128* 120* 106* 124*  --   BUN 19 20 13 11 17   --   CREATININE 0.75 0.66 0.55 0.54 0.91  --   CALCIUM 7.4* 7.2* 8.0* 7.6* 7.8*  --   MG 1.5 1.5  --   --   --   --    Liver Function Tests:  Recent Labs  12/23/14 1553  AST 67*  ALT 24  ALKPHOS 93  BILITOT 1.9*  PROT 6.3  ALBUMIN 3.5   CBC:  Recent Labs  12/23/14 1553  12/25/14 0437 12/26/14 0500 12/30/14 0545  WBC 7.9  < > 9.0 10.8* 7.7  NEUTROABS 6.5  --   --   --   --   HGB 15.3*  < > 12.5 12.8 11.3*  HCT 45.7  < > 37.3 38.5 34.2*  MCV 95.2  < > 94.9 94.1 94.2  PLT 133*  < > 116* 123* 163  < > = values in this  interval not displayed.   Cardiac Enzymes:  Recent Labs  12/23/14 1553 12/23/14 2200 12/24/14 0416  TROPONINI 0.10* 0.23* 0.15*    Dg Chest 2 View  12/23/2014   CLINICAL DATA:  Fever, weakness.  EXAM: CHEST  2 VIEW  COMPARISON:  September 09, 2010.  FINDINGS: The heart size and mediastinal contours are within normal limits. Both lungs are clear. Stable hiatal hernia. No pneumothorax or pleural effusion is noted. Mild thoracic kyphosis is noted.  IMPRESSION: No acute cardiopulmonary abnormality seen.   Electronically Signed   By: Roque Lias M.D.   On: 12/23/2014 16:47    ASSESSMENT/PLAN:  Hypokalemia - increase KCl 20 meq by mouth twice a day and add 1 dose of KCl 20 meq PO X 1 ; BMP on 01/12/15 Severe protein calorie malnutrition -  albumin 2.7; continue supplementation; RD consult Leukocytosis - patient is on Prednisone taper; check CBC in 1 week Anxiety - decrease Valium to 5 mg PO Q HS Oral thrush - start Magic mouthwash 10 mL swish and spit 4 times a day 1 week Edema, BLE - decrease Lasix to 40 meq PO Q D    Spent greater than 30 minutes in patient care.     [facility], NP [employer] [phone]

## 2015-02-04 DIAGNOSIS — E876 Hypokalemia: Secondary | ICD-10-CM | POA: Diagnosis not present

## 2015-02-04 DIAGNOSIS — L89153 Pressure ulcer of sacral region, stage 3: Secondary | ICD-10-CM | POA: Diagnosis not present

## 2015-02-04 DIAGNOSIS — M810 Age-related osteoporosis without current pathological fracture: Secondary | ICD-10-CM | POA: Diagnosis not present

## 2015-02-04 DIAGNOSIS — M059 Rheumatoid arthritis with rheumatoid factor, unspecified: Secondary | ICD-10-CM | POA: Diagnosis not present

## 2015-02-04 DIAGNOSIS — E640 Sequelae of protein-calorie malnutrition: Secondary | ICD-10-CM | POA: Diagnosis not present

## 2015-02-04 DIAGNOSIS — B370 Candidal stomatitis: Secondary | ICD-10-CM | POA: Diagnosis not present

## 2015-02-04 DIAGNOSIS — L03818 Cellulitis of other sites: Secondary | ICD-10-CM | POA: Diagnosis not present

## 2015-02-07 DIAGNOSIS — E876 Hypokalemia: Secondary | ICD-10-CM | POA: Diagnosis not present

## 2015-02-07 DIAGNOSIS — M810 Age-related osteoporosis without current pathological fracture: Secondary | ICD-10-CM | POA: Diagnosis not present

## 2015-02-07 DIAGNOSIS — E640 Sequelae of protein-calorie malnutrition: Secondary | ICD-10-CM | POA: Diagnosis not present

## 2015-02-07 DIAGNOSIS — M059 Rheumatoid arthritis with rheumatoid factor, unspecified: Secondary | ICD-10-CM | POA: Diagnosis not present

## 2015-02-07 DIAGNOSIS — L89153 Pressure ulcer of sacral region, stage 3: Secondary | ICD-10-CM | POA: Diagnosis not present

## 2015-02-07 DIAGNOSIS — B370 Candidal stomatitis: Secondary | ICD-10-CM | POA: Diagnosis not present

## 2015-02-07 DIAGNOSIS — L03818 Cellulitis of other sites: Secondary | ICD-10-CM | POA: Diagnosis not present

## 2015-02-09 DIAGNOSIS — R079 Chest pain, unspecified: Secondary | ICD-10-CM | POA: Diagnosis not present

## 2015-02-12 DIAGNOSIS — B370 Candidal stomatitis: Secondary | ICD-10-CM | POA: Diagnosis not present

## 2015-02-12 DIAGNOSIS — E876 Hypokalemia: Secondary | ICD-10-CM | POA: Diagnosis not present

## 2015-02-12 DIAGNOSIS — E640 Sequelae of protein-calorie malnutrition: Secondary | ICD-10-CM | POA: Diagnosis not present

## 2015-02-12 DIAGNOSIS — L89153 Pressure ulcer of sacral region, stage 3: Secondary | ICD-10-CM | POA: Diagnosis not present

## 2015-02-12 DIAGNOSIS — M810 Age-related osteoporosis without current pathological fracture: Secondary | ICD-10-CM | POA: Diagnosis not present

## 2015-02-12 DIAGNOSIS — M059 Rheumatoid arthritis with rheumatoid factor, unspecified: Secondary | ICD-10-CM | POA: Diagnosis not present

## 2015-02-12 DIAGNOSIS — L03818 Cellulitis of other sites: Secondary | ICD-10-CM | POA: Diagnosis not present

## 2015-02-13 DIAGNOSIS — L03818 Cellulitis of other sites: Secondary | ICD-10-CM | POA: Diagnosis not present

## 2015-02-13 DIAGNOSIS — E876 Hypokalemia: Secondary | ICD-10-CM | POA: Diagnosis not present

## 2015-02-13 DIAGNOSIS — L89153 Pressure ulcer of sacral region, stage 3: Secondary | ICD-10-CM | POA: Diagnosis not present

## 2015-02-13 DIAGNOSIS — B370 Candidal stomatitis: Secondary | ICD-10-CM | POA: Diagnosis not present

## 2015-02-13 DIAGNOSIS — E640 Sequelae of protein-calorie malnutrition: Secondary | ICD-10-CM | POA: Diagnosis not present

## 2015-02-13 DIAGNOSIS — M059 Rheumatoid arthritis with rheumatoid factor, unspecified: Secondary | ICD-10-CM | POA: Diagnosis not present

## 2015-02-13 DIAGNOSIS — M810 Age-related osteoporosis without current pathological fracture: Secondary | ICD-10-CM | POA: Diagnosis not present

## 2015-02-14 DIAGNOSIS — L03818 Cellulitis of other sites: Secondary | ICD-10-CM | POA: Diagnosis not present

## 2015-02-14 DIAGNOSIS — B370 Candidal stomatitis: Secondary | ICD-10-CM | POA: Diagnosis not present

## 2015-02-14 DIAGNOSIS — M059 Rheumatoid arthritis with rheumatoid factor, unspecified: Secondary | ICD-10-CM | POA: Diagnosis not present

## 2015-02-14 DIAGNOSIS — E876 Hypokalemia: Secondary | ICD-10-CM | POA: Diagnosis not present

## 2015-02-14 DIAGNOSIS — E640 Sequelae of protein-calorie malnutrition: Secondary | ICD-10-CM | POA: Diagnosis not present

## 2015-02-14 DIAGNOSIS — M810 Age-related osteoporosis without current pathological fracture: Secondary | ICD-10-CM | POA: Diagnosis not present

## 2015-02-14 DIAGNOSIS — L89153 Pressure ulcer of sacral region, stage 3: Secondary | ICD-10-CM | POA: Diagnosis not present

## 2015-02-15 DIAGNOSIS — M810 Age-related osteoporosis without current pathological fracture: Secondary | ICD-10-CM | POA: Diagnosis not present

## 2015-02-15 DIAGNOSIS — L03818 Cellulitis of other sites: Secondary | ICD-10-CM | POA: Diagnosis not present

## 2015-02-15 DIAGNOSIS — B370 Candidal stomatitis: Secondary | ICD-10-CM | POA: Diagnosis not present

## 2015-02-15 DIAGNOSIS — E876 Hypokalemia: Secondary | ICD-10-CM | POA: Diagnosis not present

## 2015-02-15 DIAGNOSIS — E640 Sequelae of protein-calorie malnutrition: Secondary | ICD-10-CM | POA: Diagnosis not present

## 2015-02-15 DIAGNOSIS — L89153 Pressure ulcer of sacral region, stage 3: Secondary | ICD-10-CM | POA: Diagnosis not present

## 2015-02-15 DIAGNOSIS — M059 Rheumatoid arthritis with rheumatoid factor, unspecified: Secondary | ICD-10-CM | POA: Diagnosis not present

## 2015-02-16 DIAGNOSIS — M810 Age-related osteoporosis without current pathological fracture: Secondary | ICD-10-CM | POA: Diagnosis not present

## 2015-02-16 DIAGNOSIS — B370 Candidal stomatitis: Secondary | ICD-10-CM | POA: Diagnosis not present

## 2015-02-16 DIAGNOSIS — E640 Sequelae of protein-calorie malnutrition: Secondary | ICD-10-CM | POA: Diagnosis not present

## 2015-02-16 DIAGNOSIS — L89153 Pressure ulcer of sacral region, stage 3: Secondary | ICD-10-CM | POA: Diagnosis not present

## 2015-02-16 DIAGNOSIS — E876 Hypokalemia: Secondary | ICD-10-CM | POA: Diagnosis not present

## 2015-02-16 DIAGNOSIS — M059 Rheumatoid arthritis with rheumatoid factor, unspecified: Secondary | ICD-10-CM | POA: Diagnosis not present

## 2015-02-16 DIAGNOSIS — L03818 Cellulitis of other sites: Secondary | ICD-10-CM | POA: Diagnosis not present

## 2015-02-20 DIAGNOSIS — E640 Sequelae of protein-calorie malnutrition: Secondary | ICD-10-CM | POA: Diagnosis not present

## 2015-02-20 DIAGNOSIS — B370 Candidal stomatitis: Secondary | ICD-10-CM | POA: Diagnosis not present

## 2015-02-20 DIAGNOSIS — L03818 Cellulitis of other sites: Secondary | ICD-10-CM | POA: Diagnosis not present

## 2015-02-20 DIAGNOSIS — L89153 Pressure ulcer of sacral region, stage 3: Secondary | ICD-10-CM | POA: Diagnosis not present

## 2015-02-20 DIAGNOSIS — M810 Age-related osteoporosis without current pathological fracture: Secondary | ICD-10-CM | POA: Diagnosis not present

## 2015-02-20 DIAGNOSIS — M059 Rheumatoid arthritis with rheumatoid factor, unspecified: Secondary | ICD-10-CM | POA: Diagnosis not present

## 2015-02-20 DIAGNOSIS — E876 Hypokalemia: Secondary | ICD-10-CM | POA: Diagnosis not present

## 2015-02-21 DIAGNOSIS — L03818 Cellulitis of other sites: Secondary | ICD-10-CM | POA: Diagnosis not present

## 2015-02-21 DIAGNOSIS — M810 Age-related osteoporosis without current pathological fracture: Secondary | ICD-10-CM | POA: Diagnosis not present

## 2015-02-21 DIAGNOSIS — B370 Candidal stomatitis: Secondary | ICD-10-CM | POA: Diagnosis not present

## 2015-02-21 DIAGNOSIS — E876 Hypokalemia: Secondary | ICD-10-CM | POA: Diagnosis not present

## 2015-02-21 DIAGNOSIS — L89153 Pressure ulcer of sacral region, stage 3: Secondary | ICD-10-CM | POA: Diagnosis not present

## 2015-02-21 DIAGNOSIS — M059 Rheumatoid arthritis with rheumatoid factor, unspecified: Secondary | ICD-10-CM | POA: Diagnosis not present

## 2015-02-21 DIAGNOSIS — E640 Sequelae of protein-calorie malnutrition: Secondary | ICD-10-CM | POA: Diagnosis not present

## 2015-02-23 DIAGNOSIS — L03818 Cellulitis of other sites: Secondary | ICD-10-CM | POA: Diagnosis not present

## 2015-02-23 DIAGNOSIS — E876 Hypokalemia: Secondary | ICD-10-CM | POA: Diagnosis not present

## 2015-02-23 DIAGNOSIS — M810 Age-related osteoporosis without current pathological fracture: Secondary | ICD-10-CM | POA: Diagnosis not present

## 2015-02-23 DIAGNOSIS — M059 Rheumatoid arthritis with rheumatoid factor, unspecified: Secondary | ICD-10-CM | POA: Diagnosis not present

## 2015-02-23 DIAGNOSIS — B370 Candidal stomatitis: Secondary | ICD-10-CM | POA: Diagnosis not present

## 2015-02-23 DIAGNOSIS — E640 Sequelae of protein-calorie malnutrition: Secondary | ICD-10-CM | POA: Diagnosis not present

## 2015-02-23 DIAGNOSIS — L89153 Pressure ulcer of sacral region, stage 3: Secondary | ICD-10-CM | POA: Diagnosis not present

## 2015-02-25 ENCOUNTER — Emergency Department (HOSPITAL_COMMUNITY): Payer: Medicare Other

## 2015-02-25 ENCOUNTER — Emergency Department (HOSPITAL_COMMUNITY)
Admission: EM | Admit: 2015-02-25 | Discharge: 2015-02-25 | Disposition: A | Discharge status: Home / Self Care | Payer: Medicare Other | Attending: Emergency Medicine | Admitting: Emergency Medicine

## 2015-02-25 ENCOUNTER — Encounter (HOSPITAL_COMMUNITY): Payer: Self-pay | Admitting: Emergency Medicine

## 2015-02-25 DIAGNOSIS — S3992XA Unspecified injury of lower back, initial encounter: Secondary | ICD-10-CM | POA: Diagnosis not present

## 2015-02-25 DIAGNOSIS — R4182 Altered mental status, unspecified: Secondary | ICD-10-CM | POA: Diagnosis not present

## 2015-02-25 DIAGNOSIS — Y92091 Bathroom in other non-institutional residence as the place of occurrence of the external cause: Secondary | ICD-10-CM | POA: Diagnosis not present

## 2015-02-25 DIAGNOSIS — S0990XA Unspecified injury of head, initial encounter: Secondary | ICD-10-CM | POA: Diagnosis not present

## 2015-02-25 DIAGNOSIS — R9431 Abnormal electrocardiogram [ECG] [EKG]: Secondary | ICD-10-CM | POA: Diagnosis not present

## 2015-02-25 DIAGNOSIS — W01198A Fall on same level from slipping, tripping and stumbling with subsequent striking against other object, initial encounter: Secondary | ICD-10-CM | POA: Insufficient documentation

## 2015-02-25 DIAGNOSIS — M810 Age-related osteoporosis without current pathological fracture: Secondary | ICD-10-CM | POA: Diagnosis not present

## 2015-02-25 DIAGNOSIS — Y9389 Activity, other specified: Secondary | ICD-10-CM | POA: Insufficient documentation

## 2015-02-25 DIAGNOSIS — Z7952 Long term (current) use of systemic steroids: Secondary | ICD-10-CM | POA: Diagnosis not present

## 2015-02-25 DIAGNOSIS — S299XXA Unspecified injury of thorax, initial encounter: Secondary | ICD-10-CM | POA: Diagnosis not present

## 2015-02-25 DIAGNOSIS — Z792 Long term (current) use of antibiotics: Secondary | ICD-10-CM | POA: Insufficient documentation

## 2015-02-25 DIAGNOSIS — Y998 Other external cause status: Secondary | ICD-10-CM | POA: Diagnosis not present

## 2015-02-25 DIAGNOSIS — S22080A Wedge compression fracture of T11-T12 vertebra, initial encounter for closed fracture: Secondary | ICD-10-CM | POA: Diagnosis not present

## 2015-02-25 DIAGNOSIS — Z79899 Other long term (current) drug therapy: Secondary | ICD-10-CM | POA: Diagnosis not present

## 2015-02-25 DIAGNOSIS — Z8679 Personal history of other diseases of the circulatory system: Secondary | ICD-10-CM | POA: Insufficient documentation

## 2015-02-25 DIAGNOSIS — IMO0002 Reserved for concepts with insufficient information to code with codable children: Secondary | ICD-10-CM

## 2015-02-25 DIAGNOSIS — M4854XA Collapsed vertebra, not elsewhere classified, thoracic region, initial encounter for fracture: Secondary | ICD-10-CM | POA: Diagnosis not present

## 2015-02-25 DIAGNOSIS — Z7982 Long term (current) use of aspirin: Secondary | ICD-10-CM | POA: Insufficient documentation

## 2015-02-25 DIAGNOSIS — R443 Hallucinations, unspecified: Secondary | ICD-10-CM | POA: Insufficient documentation

## 2015-02-25 DIAGNOSIS — M545 Low back pain: Secondary | ICD-10-CM | POA: Diagnosis not present

## 2015-02-25 DIAGNOSIS — R109 Unspecified abdominal pain: Secondary | ICD-10-CM | POA: Diagnosis not present

## 2015-02-25 LAB — URINALYSIS, ROUTINE W REFLEX MICROSCOPIC
Bilirubin Urine: NEGATIVE
Glucose, UA: NEGATIVE mg/dL
Hgb urine dipstick: NEGATIVE
Ketones, ur: NEGATIVE mg/dL
Leukocytes, UA: NEGATIVE
NITRITE: NEGATIVE
PH: 6.5 (ref 5.0–8.0)
Protein, ur: NEGATIVE mg/dL
Specific Gravity, Urine: 1.013 (ref 1.005–1.030)
Urobilinogen, UA: 0.2 mg/dL (ref 0.0–1.0)

## 2015-02-25 LAB — COMPREHENSIVE METABOLIC PANEL
ALT: 14 U/L (ref 0–35)
AST: 26 U/L (ref 0–37)
Albumin: 3.1 g/dL — ABNORMAL LOW (ref 3.5–5.2)
Alkaline Phosphatase: 64 U/L (ref 39–117)
Anion gap: 12 (ref 5–15)
BUN: 17 mg/dL (ref 6–23)
CHLORIDE: 97 mmol/L (ref 96–112)
CO2: 28 mmol/L (ref 19–32)
Calcium: 8.6 mg/dL (ref 8.4–10.5)
Creatinine, Ser: 0.72 mg/dL (ref 0.50–1.10)
GFR calc Af Amer: 88 mL/min — ABNORMAL LOW (ref >90)
GFR, EST NON AFRICAN AMERICAN: 76 mL/min — AB (ref >90)
Glucose, Bld: 103 mg/dL — ABNORMAL HIGH (ref 70–99)
POTASSIUM: 3.4 mmol/L — AB (ref 3.5–5.1)
Sodium: 137 mmol/L (ref 135–145)
TOTAL PROTEIN: 6.6 g/dL (ref 6.0–8.3)
Total Bilirubin: 0.9 mg/dL (ref 0.3–1.2)

## 2015-02-25 LAB — CBC
HEMATOCRIT: 35.3 % — AB (ref 36.0–46.0)
Hemoglobin: 11.4 g/dL — ABNORMAL LOW (ref 12.0–15.0)
MCH: 31.8 pg (ref 26.0–34.0)
MCHC: 32.3 g/dL (ref 30.0–36.0)
MCV: 98.6 fL (ref 78.0–100.0)
PLATELETS: 203 10*3/uL (ref 150–400)
RBC: 3.58 MIL/uL — ABNORMAL LOW (ref 3.87–5.11)
RDW: 15.5 % (ref 11.5–15.5)
WBC: 13.8 10*3/uL — ABNORMAL HIGH (ref 4.0–10.5)

## 2015-02-25 LAB — PROTIME-INR
INR: 1.07 (ref 0.00–1.49)
Prothrombin Time: 14 seconds (ref 11.6–15.2)

## 2015-02-25 LAB — I-STAT TROPONIN, ED: TROPONIN I, POC: 0.01 ng/mL (ref 0.00–0.08)

## 2015-02-25 LAB — CBG MONITORING, ED: Glucose-Capillary: 90 mg/dL (ref 70–99)

## 2015-02-25 MED ORDER — HYDROCODONE-ACETAMINOPHEN 5-325 MG PO TABS: 1.0000 | ORAL_TABLET | ORAL | Status: AC | PRN

## 2015-02-25 MED ORDER — ONDANSETRON 4 MG PO TBDP: 4.0000 mg | ORAL_TABLET | Freq: Three times a day (TID) | ORAL | Status: AC | PRN

## 2015-02-25 NOTE — Discharge Instructions (Signed)
Take the prescribed medication as directed. Pain medication may cause some constipation, you may wish to take a stool softener such as Dulcolax, Colace, or MiraLAX to help these bowel movements. Recommend follow-up with primary care physician regarding medications as this may be the source of her hallucinations. Follow-up with neurosurgery, Dr. Yetta Barre in 2 weeks. Recommend to call his office in the morning to schedule appointment. Return to the ED for new or worsening symptoms.

## 2015-02-25 NOTE — ED Notes (Signed)
Got pt up to the bathroom, unable to get a urine sample. Attempted IV once, unsuccessful. Alerted CN to need for IV and will alert PA to need for cath

## 2015-02-25 NOTE — ED Provider Notes (Signed)
Medical screening examination/treatment/procedure(s) were performed by non-physician practitioner and as supervising physician I was immediately available for consultation/collaboration.   EKG Interpretation   Date/Time:  Sunday February 25 2015 12:44:46 EST Ventricular Rate:  71 PR Interval:  142 QRS Duration: 113 QT Interval:  458 QTC Calculation: 498 R Axis:   3 Text Interpretation:  Sinus rhythm Supraventricular bigeminy    intraventricular conduction delay Nochange vs 12-23-2014 Confirmed by Fayrene Fearing   MD, MARK ([id number]) on 02/25/2015 12:54:45 PM     Patient here with having transient altered mental status as well as back pain. Smoker. Positive for an acute thoracic spine fracture. Patient is on multiple medications and suspect that this is etiology of her transient altered mental status. Patient is alert and oriented 3 here at this time. She is afebrile. Patient's urinalysis and CT are within normal limits. Electrolytes are essentially normal. Will obtain imaging of patient's thoracic spine and get follow-up  Toy Baker, MD 02/25/15 1409

## 2015-02-25 NOTE — ED Provider Notes (Signed)
CSN: [id number]     Arrival date & time 02/25/15  1103 History   First MD Initiated Contact with Patient 02/25/15 1111     Chief Complaint  Patient presents with  . Altered Mental Status     (Consider location/radiation/quality/duration/timing/severity/associated sxs/prior Treatment) Patient is a 81 y.o. female presenting with altered mental status. The history is provided by the patient and medical records.  Altered Mental Status   This is an 81 year old female with past medical history significant for arrhythmia, osteoporosis, rheumatoid arthritis, presenting to the ED for altered mental status. Patient is brought in by her daughter. Patient had a witnessed fall on Thursday while washing her face. States she attempted to pivot and turn around when she lost her footing and fell hitting the back of her head against the bathroom door. She did not lose consciousness. Patient refused to be seen at that time and states she was "fine". Daughter states since fall she has "not been acting right". States earlier this morning she was having hallucinations that there were plastic bags flying around in her bedroom and also that she had tennis shoes on with gum on the bottom of them while she was lying in bed.  Patient has no history of dementia. She has no psychiatric history. Daughter also notes that her urine has had a strong odor, she does have history of UTI. No fever or chills. Patient states she has some low back pain from the fall. She denies numbness or weakness of her extremities.  Patient has somewhat difficult time walking at baseline.  Patient has no history of TIA or stroke. She is not currently on any type of anticoagulation.  Patient currently in home PT/OT as she is non- ambulatory at this time.  Past Medical History  Diagnosis Date  . Rheumatoid arthritis   . Irregular heartbeat   . Osteoporosis    Past Surgical History  Procedure Laterality Date  . Appendectomy    . Abdominal  hysterectomy    . Cataract surgery     Family History  Problem Relation Age of Onset  . Cancer Sister     Brain cancer  . Cancer Brother     Lung cancer  . Stroke Father   . Heart attack Mother    History  Substance Use Topics  . Smoking status: Never Smoker   . Smokeless tobacco: Never Used  . Alcohol Use: No   OB History    No data available     Review of Systems  Musculoskeletal: Positive for back pain.  All other systems reviewed and are negative.     Allergies  Codeine and Clarithromycin  Home Medications   Prior to Admission medications   Medication Sig Start Date End Date Taking? Authorizing Provider  acetaminophen (TYLENOL) 325 MG tablet Take 2 tablets (650 mg total) by mouth every 6 (six) hours as needed for mild pain (or Fever >/= 101). 01/01/15   Alison Murray, MD  acyclovir (ZOVIRAX) 800 MG tablet Take 1 tablet (800 mg total) by mouth 5 (five) times daily. 01/01/15   Alison Murray, MD  alum & mag hydroxide-simeth (MAALOX/MYLANTA) 200-200-20 MG/5ML suspension Take 30 mLs by mouth every 6 (six) hours as needed for indigestion or heartburn (dyspepsia). 01/01/15   Alison Murray, MD  antiseptic oral rinse (CPC / CETYLPYRIDINIUM CHLORIDE 0.05%) 0.05 % LIQD solution 7 mLs by Mouth Rinse route 2 (two) times daily. 01/01/15   Alison Murray, MD  aspirin EC 81 MG  tablet Take 81 mg by mouth daily.    Historical Provider, MD  calcium carbonate (OS-CAL) 600 MG TABS tablet Take 600 mg by mouth every other day.    Historical Provider, MD  Cyanocobalamin (VITAMIN B 12 PO) Take 1 tablet by mouth daily.    Historical Provider, MD  diazepam (VALIUM) 5 MG tablet Take one tablet by mouth every night at bedtime for rest 01/10/15   [facility], DO  diphenhydrAMINE-zinc acetate (BENADRYL) cream Apply topically 3 (three) times daily as needed for itching. 01/01/15   Alison Murray, MD  diphenoxylate-atropine (LOMOTIL) 2.5-0.025 MG per tablet Take 1 tablet by mouth 4 (four)  times daily as needed for diarrhea or loose stools. 01/01/15   Alison Murray, MD  doxepin (SINEQUAN) 10 MG capsule Take 1 capsule (10 mg total) by mouth at bedtime as needed (Itching.). 01/01/15   Alison Murray, MD  doxycycline (VIBRA-TABS) 100 MG tablet Take 1 tablet (100 mg total) by mouth every 12 (twelve) hours. 01/01/15   Alison Murray, MD  feeding supplement, ENSURE, (ENSURE) PUDG Take 1 Container by mouth 2 (two) times daily with a meal. 01/01/15   Alison Murray, MD  feeding supplement, RESOURCE BREEZE, (RESOURCE BREEZE) LIQD Take 1 Container by mouth 2 (two) times daily between meals. 01/01/15   Alison Murray, MD  folic acid (FOLVITE) 1 MG tablet Take 1 mg by mouth daily.    Historical Provider, MD  furosemide (LASIX) 40 MG tablet Take 1 tablet (40 mg total) by mouth daily. 01/01/15   Alison Murray, MD  hydroxychloroquine (PLAQUENIL) 200 MG tablet Take 200 mg by mouth at bedtime.     Historical Provider, MD  methylPREDNISolone (MEDROL) 4 MG tablet Take 4 mg by mouth at bedtime.     Historical Provider, MD  polyethylene glycol (MIRALAX / GLYCOLAX) packet Take 17 g by mouth daily as needed for mild constipation. 01/01/15   Alison Murray, MD  potassium chloride (K-DUR,KLOR-CON) 10 MEQ tablet Take 20 mEq by mouth daily.    Historical Provider, MD  predniSONE (DELTASONE) 5 MG tablet Take 10 tablets (50 mg total) by mouth daily with breakfast. 01/01/15   Alison Murray, MD  propranolol (INDERAL) 20 MG tablet Take 20 mg by mouth 2 (two) times daily.    Historical Provider, MD  simethicone (MYLICON) 80 MG chewable tablet Chew 1 tablet (80 mg total) by mouth every 6 (six) hours as needed for flatulence. 01/01/15   Alison Murray, MD  traMADol (ULTRAM) 50 MG tablet Take 1 tablet (50 mg total) by mouth every 6 (six) hours as needed for moderate pain. 01/01/15   Alison Murray, MD  Triamcinolone Acetonide (KENALOG IJ) Inject 1 each as directed once a week. Got in right knee at dr's office--Wed    Historical Provider,  MD   BP 126/47 mmHg  Pulse 71  Temp(Src) 97.7 F (36.5 C) (Oral)  Resp 18  SpO2 95%   Physical Exam  Constitutional: She is oriented to person, place, and time. She appears well-developed and well-nourished.  HENT:  Head: Normocephalic and atraumatic. Head is without raccoon's eyes, without Battle's sign, without contusion and without laceration.  Mouth/Throat: Oropharynx is clear and moist.  Eyes: Conjunctivae and EOM are normal. Pupils are equal, round, and reactive to light.  Neck: Normal range of motion.  Cardiovascular: Normal rate, regular rhythm and normal heart sounds.   Pulmonary/Chest: Effort normal and breath sounds normal.  Abdominal: Soft. Bowel  sounds are normal.  Musculoskeletal: Normal range of motion.       Cervical back: Normal.       Thoracic back: Normal.       Lumbar back: She exhibits tenderness, bony tenderness and pain.       Back:  Pelvis non-tender, hips stable, full ROM of bilateral hips without pain; no noted leg shortening Lumbar spine with mild tenderness bilaterally; no gross deformites  Neurological: She is alert and oriented to person, place, and time.  AAOx3, answering questions and following commands appropriately; slightly decreased strength of right leg when compared with left (baselien); equal grip strengths; CN grossly intact; moves all extremities appropriately without ataxia; no focal neuro deficits or facial asymmetry appreciated  Skin: Skin is warm and dry.  Scattered bruising across upper and lower extremities in various stages of healing, no active bleeding  Psychiatric: She has a normal mood and affect. She is not actively hallucinating.  No active hallucinations  Nursing note and vitals reviewed.   ED Course  Procedures (including critical care time) Labs Review Labs Reviewed  CBC - Abnormal; Notable for the following:    WBC 13.8 (*)    RBC 3.58 (*)    Hemoglobin 11.4 (*)    HCT 35.3 (*)    All other components within normal  limits  COMPREHENSIVE METABOLIC PANEL - Abnormal; Notable for the following:    Potassium 3.4 (*)    Glucose, Bld 103 (*)    Albumin 3.1 (*)    GFR calc non Af Amer 76 (*)    GFR calc Af Amer 88 (*)    All other components within normal limits  PROTIME-INR  URINALYSIS, ROUTINE W REFLEX MICROSCOPIC  CBG MONITORING, ED  Rosezena Sensor, ED    Imaging Review Dg Chest 2 View  02/25/2015   CLINICAL DATA:  Altered mental status.  Recent falls.  EXAM: CHEST  2 VIEW  COMPARISON:  12/23/2014  FINDINGS: Heart size is within normal limits. Both lungs are clear. No evidence of pleural effusion. A moderate to large hiatal hernia is seen which is increased in size since prior study. A T12 vertebral body which compression fracture is seen which is new since previous study.  IMPRESSION: No active cardiopulmonary disease.  Increased size of moderate to large hiatal hernia.  New acute or subacute T12 vertebral body which compression fracture since 12/23/2014.   Electronically Signed   By: Myles Rosenthal M.D.   On: 02/25/2015 13:31   Dg Lumbar Spine Complete  02/25/2015   CLINICAL DATA:  Fall in bathroom.  Low back injury and pain.  EXAM: LUMBAR SPINE - COMPLETE 4+ VIEW  COMPARISON:  09/11/2014  FINDINGS: A moderate compression fracture of the T12 vertebral body is seen which is of indeterminate age, but is new since prior study on 09/11/2014. No evidence of lumbar spine fracture. No evidence of subluxation.  Moderate to severe degenerative disc disease is seen throughout the lumbar spine, which is most severe at L2-3. Bilateral facet DJD is also seen which is most pronounced at levels of L4-5 L5-S1. Generalized osteopenia noted.  IMPRESSION: Moderate compression fracture of T12 vertebral body which is of indeterminate age, but is new since prior exam on 09/11/2014. Consider CT for further evaluation.  No evidence of acute lumbar spine fracture or subluxation.  Degenerative spondylosis, as described above.  Osteopenia.    Electronically Signed   By: Myles Rosenthal M.D.   On: 02/25/2015 13:34   Ct Head Wo Contrast  02/25/2015   CLINICAL DATA:  Altered mental status after a fall 3 days ago.  EXAM: CT HEAD WITHOUT CONTRAST  TECHNIQUE: Contiguous axial images were obtained from the base of the skull through the vertex without intravenous contrast.  COMPARISON:  None.  FINDINGS: Sinuses/Soft tissues: Surgical changes about the globes. New complete opacification of bilateral maxillary sinuses. Mucosal thickening involves ethmoid air cells and frontal sinuses. Clear mastoid air cells.  Intracranial: Mild low density in the periventricular white matter likely related to small vessel disease. No mass lesion, hemorrhage, hydrocephalus, acute infarct, intra-axial, or extra-axial fluid collection.  IMPRESSION: 1.  No acute intracranial abnormality. 2. Sinus disease.   Electronically Signed   By: Jeronimo Greaves M.D.   On: 02/25/2015 13:37   Ct Thoracic Spine Wo Contrast  02/25/2015   CLINICAL DATA:  Further evaluation of T12 fracture reported on plain film, recent fall 3 days ago  EXAM: CT THORACIC SPINE WITHOUT CONTRAST  TECHNIQUE: Multidetector CT imaging of the thoracic spine was performed without intravenous contrast administration. Multiplanar CT image reconstructions were also generated.  COMPARISON:  02/25/2015, 12/23/2014  FINDINGS: Old fracture right L1 transverse process. T12 compression deformity of about 50%. Fracture line is seen through the anterior aspect of the vertebral body with air tracking through the fracture line, with disruption of the superior and inferior cortices posteriorly. There is retropulsion of the posterior cortex of about 4 mm, causing mild to moderate canal narrowing.  There is diffuse osteopenia. There is degenerative disc disease throughout the thoracic spine. There is a large hiatal hernia. There are very small bilateral pleural effusions.  IMPRESSION: Acute appearing approximately 50% compression deformity  of T12 vertebral body with retropulsion.  Large hiatal hernia.   Electronically Signed   By: Esperanza Heir M.D.   On: 02/25/2015 15:31     EKG Interpretation None      MDM   Final diagnoses:  Altered mental status  Hallucinations  Compression fracture   81 year old female here with hallucinations. Patient had a witnessed mechanical fall 2 days ago with head injury. She was evaluated that time as she refused to come to the hospital. On exam, patient is alert and oriented. She is not having any active hallucinations. She only complains of back pain at this time. Will obtain CT head, chest x-ray, plain films of lumbar spine as well as lab work.  Labwork reassuring. UA negative for infection. CT head and chest x-ray negative for acute findings. Lumbar spine films with evidence of T12 compression fracture. CT was obtained for further evaluation revealing approximately 50% compression deformity and 4 mm of retropulsion. Patient remains without any focal neurologic deficits on exam. Case was discussed with neurosurgery, Dr. Yetta Barre-- recommends TLSO brace and office FU in 2 weeks.  Patient has remained without any active hallucinations in the ED. I have reviewed her medication list, there've been no recent changes but she is on multiple sedating medications which may be the cause of this. I've encouraged patient to follow-up with her primary care physician to discuss this.  Rx vicodin for pain PRN, recommended stool softener if needed to help prevent constipation.  Discussed plan with patient, he/she acknowledged understanding and agreed with plan of care.  Return precautions given for new or worsening symptoms.  Case discussed with attending physician, Dr. Freida Busman, who evaluated patient and agrees with assessment and plan of care.  Garlon Hatchet, PA-C 02/25/15 1625

## 2015-02-25 NOTE — ED Notes (Signed)
Fall Thursday, hit head, refused to be seen. Daughter states since fall the pt has not been acting like herself. Today began having hallucinations. EMS states pt alert and oriented when questioned, they have not observed any hallucinations in route. Per EMS pt states pain to bilateral flank area. Daughter states strong odor to urine. Normally has difficulty with walking.

## 2015-02-25 NOTE — ED Notes (Signed)
Ortho at bedside to apply back brace.

## 2015-02-25 NOTE — ED Notes (Signed)
In and Out conducted by [place] and [employer]

## 2015-02-25 NOTE — ED Notes (Signed)
Bed: XT05 Expected date:  Expected time:  Means of arrival:  Comments: Ems- ams

## 2015-02-27 DIAGNOSIS — E876 Hypokalemia: Secondary | ICD-10-CM | POA: Diagnosis not present

## 2015-02-27 DIAGNOSIS — M059 Rheumatoid arthritis with rheumatoid factor, unspecified: Secondary | ICD-10-CM | POA: Diagnosis not present

## 2015-02-27 DIAGNOSIS — L89153 Pressure ulcer of sacral region, stage 3: Secondary | ICD-10-CM | POA: Diagnosis not present

## 2015-02-27 DIAGNOSIS — B370 Candidal stomatitis: Secondary | ICD-10-CM | POA: Diagnosis not present

## 2015-02-27 DIAGNOSIS — L03818 Cellulitis of other sites: Secondary | ICD-10-CM | POA: Diagnosis not present

## 2015-02-27 DIAGNOSIS — M810 Age-related osteoporosis without current pathological fracture: Secondary | ICD-10-CM | POA: Diagnosis not present

## 2015-02-27 DIAGNOSIS — E640 Sequelae of protein-calorie malnutrition: Secondary | ICD-10-CM | POA: Diagnosis not present

## 2015-02-28 DIAGNOSIS — E640 Sequelae of protein-calorie malnutrition: Secondary | ICD-10-CM | POA: Diagnosis not present

## 2015-02-28 DIAGNOSIS — E876 Hypokalemia: Secondary | ICD-10-CM | POA: Diagnosis not present

## 2015-02-28 DIAGNOSIS — L03818 Cellulitis of other sites: Secondary | ICD-10-CM | POA: Diagnosis not present

## 2015-02-28 DIAGNOSIS — L89153 Pressure ulcer of sacral region, stage 3: Secondary | ICD-10-CM | POA: Diagnosis not present

## 2015-02-28 DIAGNOSIS — M810 Age-related osteoporosis without current pathological fracture: Secondary | ICD-10-CM | POA: Diagnosis not present

## 2015-02-28 DIAGNOSIS — M059 Rheumatoid arthritis with rheumatoid factor, unspecified: Secondary | ICD-10-CM | POA: Diagnosis not present

## 2015-02-28 DIAGNOSIS — B370 Candidal stomatitis: Secondary | ICD-10-CM | POA: Diagnosis not present

## 2015-03-01 DIAGNOSIS — M810 Age-related osteoporosis without current pathological fracture: Secondary | ICD-10-CM | POA: Diagnosis not present

## 2015-03-01 DIAGNOSIS — E640 Sequelae of protein-calorie malnutrition: Secondary | ICD-10-CM | POA: Diagnosis not present

## 2015-03-01 DIAGNOSIS — B370 Candidal stomatitis: Secondary | ICD-10-CM | POA: Diagnosis not present

## 2015-03-01 DIAGNOSIS — M059 Rheumatoid arthritis with rheumatoid factor, unspecified: Secondary | ICD-10-CM | POA: Diagnosis not present

## 2015-03-01 DIAGNOSIS — L03818 Cellulitis of other sites: Secondary | ICD-10-CM | POA: Diagnosis not present

## 2015-03-01 DIAGNOSIS — E876 Hypokalemia: Secondary | ICD-10-CM | POA: Diagnosis not present

## 2015-03-01 DIAGNOSIS — L89153 Pressure ulcer of sacral region, stage 3: Secondary | ICD-10-CM | POA: Diagnosis not present

## 2015-03-02 DIAGNOSIS — L03818 Cellulitis of other sites: Secondary | ICD-10-CM | POA: Diagnosis not present

## 2015-03-02 DIAGNOSIS — L89153 Pressure ulcer of sacral region, stage 3: Secondary | ICD-10-CM | POA: Diagnosis not present

## 2015-03-02 DIAGNOSIS — E876 Hypokalemia: Secondary | ICD-10-CM | POA: Diagnosis not present

## 2015-03-02 DIAGNOSIS — E640 Sequelae of protein-calorie malnutrition: Secondary | ICD-10-CM | POA: Diagnosis not present

## 2015-03-02 DIAGNOSIS — M810 Age-related osteoporosis without current pathological fracture: Secondary | ICD-10-CM | POA: Diagnosis not present

## 2015-03-02 DIAGNOSIS — M059 Rheumatoid arthritis with rheumatoid factor, unspecified: Secondary | ICD-10-CM | POA: Diagnosis not present

## 2015-03-02 DIAGNOSIS — B370 Candidal stomatitis: Secondary | ICD-10-CM | POA: Diagnosis not present

## 2015-03-03 DIAGNOSIS — L03818 Cellulitis of other sites: Secondary | ICD-10-CM | POA: Diagnosis not present

## 2015-03-03 DIAGNOSIS — M810 Age-related osteoporosis without current pathological fracture: Secondary | ICD-10-CM | POA: Diagnosis not present

## 2015-03-03 DIAGNOSIS — B370 Candidal stomatitis: Secondary | ICD-10-CM | POA: Diagnosis not present

## 2015-03-03 DIAGNOSIS — M059 Rheumatoid arthritis with rheumatoid factor, unspecified: Secondary | ICD-10-CM | POA: Diagnosis not present

## 2015-03-03 DIAGNOSIS — L89153 Pressure ulcer of sacral region, stage 3: Secondary | ICD-10-CM | POA: Diagnosis not present

## 2015-03-03 DIAGNOSIS — E876 Hypokalemia: Secondary | ICD-10-CM | POA: Diagnosis not present

## 2015-03-03 DIAGNOSIS — E640 Sequelae of protein-calorie malnutrition: Secondary | ICD-10-CM | POA: Diagnosis not present

## 2015-03-04 DIAGNOSIS — M069 Rheumatoid arthritis, unspecified: Secondary | ICD-10-CM | POA: Diagnosis not present

## 2015-03-04 DIAGNOSIS — L03818 Cellulitis of other sites: Secondary | ICD-10-CM | POA: Diagnosis not present

## 2015-03-04 DIAGNOSIS — B370 Candidal stomatitis: Secondary | ICD-10-CM | POA: Diagnosis not present

## 2015-03-04 DIAGNOSIS — M810 Age-related osteoporosis without current pathological fracture: Secondary | ICD-10-CM | POA: Diagnosis not present

## 2015-03-04 DIAGNOSIS — M059 Rheumatoid arthritis with rheumatoid factor, unspecified: Secondary | ICD-10-CM | POA: Diagnosis not present

## 2015-03-04 DIAGNOSIS — E876 Hypokalemia: Secondary | ICD-10-CM | POA: Diagnosis not present

## 2015-03-04 DIAGNOSIS — E640 Sequelae of protein-calorie malnutrition: Secondary | ICD-10-CM | POA: Diagnosis not present

## 2015-03-04 DIAGNOSIS — L89153 Pressure ulcer of sacral region, stage 3: Secondary | ICD-10-CM | POA: Diagnosis not present

## 2015-03-05 DIAGNOSIS — L03818 Cellulitis of other sites: Secondary | ICD-10-CM | POA: Diagnosis not present

## 2015-03-05 DIAGNOSIS — M810 Age-related osteoporosis without current pathological fracture: Secondary | ICD-10-CM | POA: Diagnosis not present

## 2015-03-05 DIAGNOSIS — E640 Sequelae of protein-calorie malnutrition: Secondary | ICD-10-CM | POA: Diagnosis not present

## 2015-03-05 DIAGNOSIS — M059 Rheumatoid arthritis with rheumatoid factor, unspecified: Secondary | ICD-10-CM | POA: Diagnosis not present

## 2015-03-05 DIAGNOSIS — E876 Hypokalemia: Secondary | ICD-10-CM | POA: Diagnosis not present

## 2015-03-05 DIAGNOSIS — L89153 Pressure ulcer of sacral region, stage 3: Secondary | ICD-10-CM | POA: Diagnosis not present

## 2015-03-05 DIAGNOSIS — B370 Candidal stomatitis: Secondary | ICD-10-CM | POA: Diagnosis not present

## 2015-06-22 DEATH — deceased

## 2015-09-24 IMAGING — CR DG LUMBAR SPINE COMPLETE 4+V
5 series · 5 of 5 positions shown · non-contrast
Comparison: 09/11/2014

CLINICAL DATA: Fall in bathroom.  Low back injury and pain.

EXAM:
LUMBAR SPINE - COMPLETE 4+ VIEW

[t lumbar spine ap]
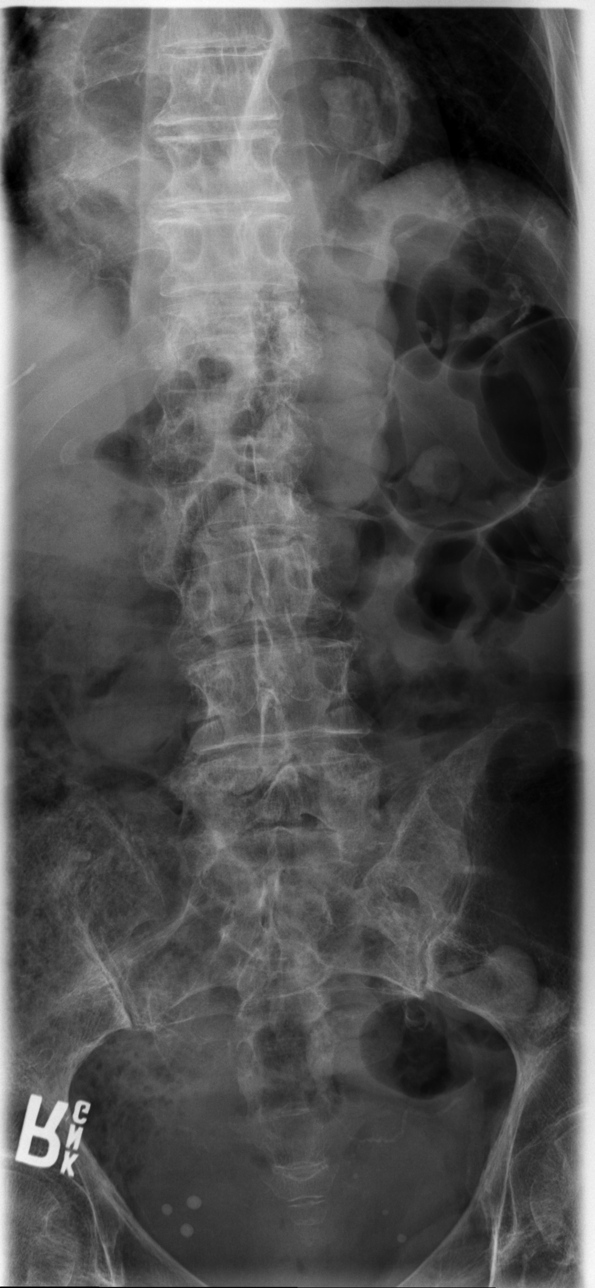

[t lumbar spine obl (1 of 2)]
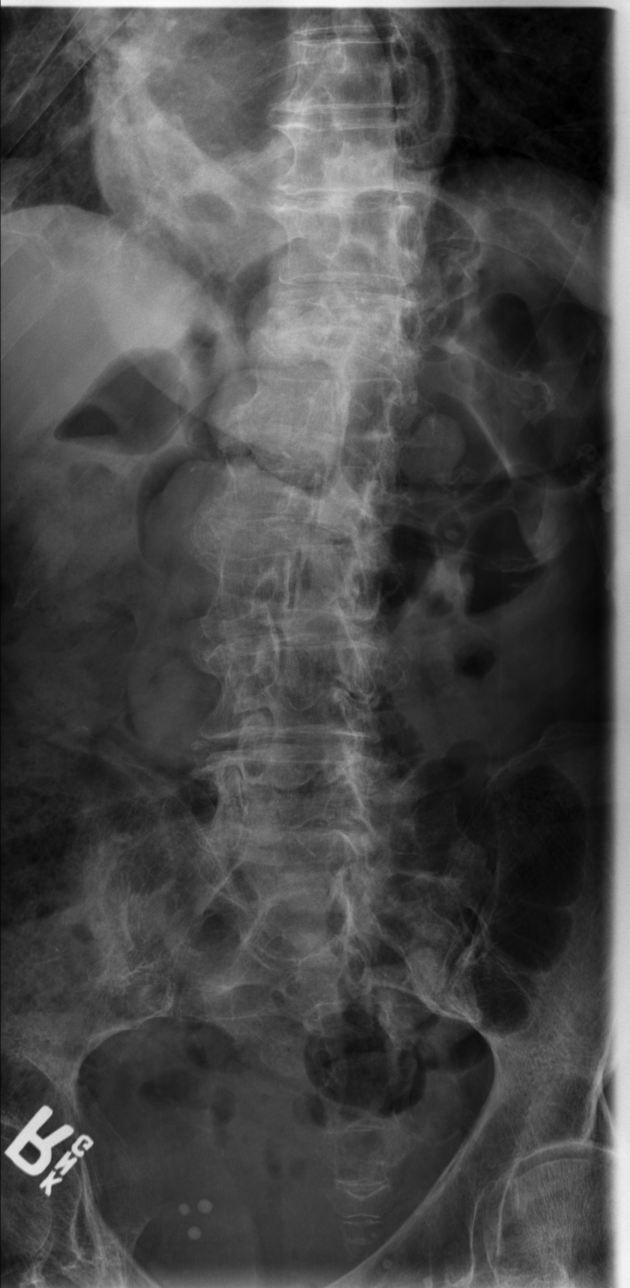

[t lumbar spine obl (2 of 2)]
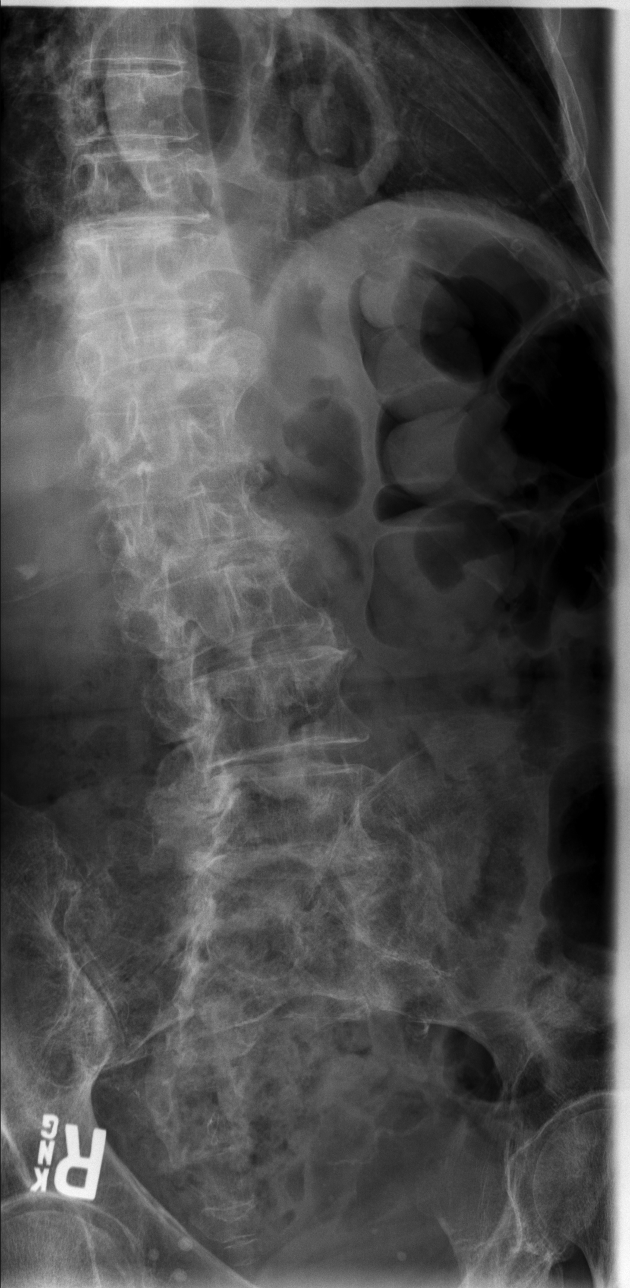

[t lumbar spine lat]
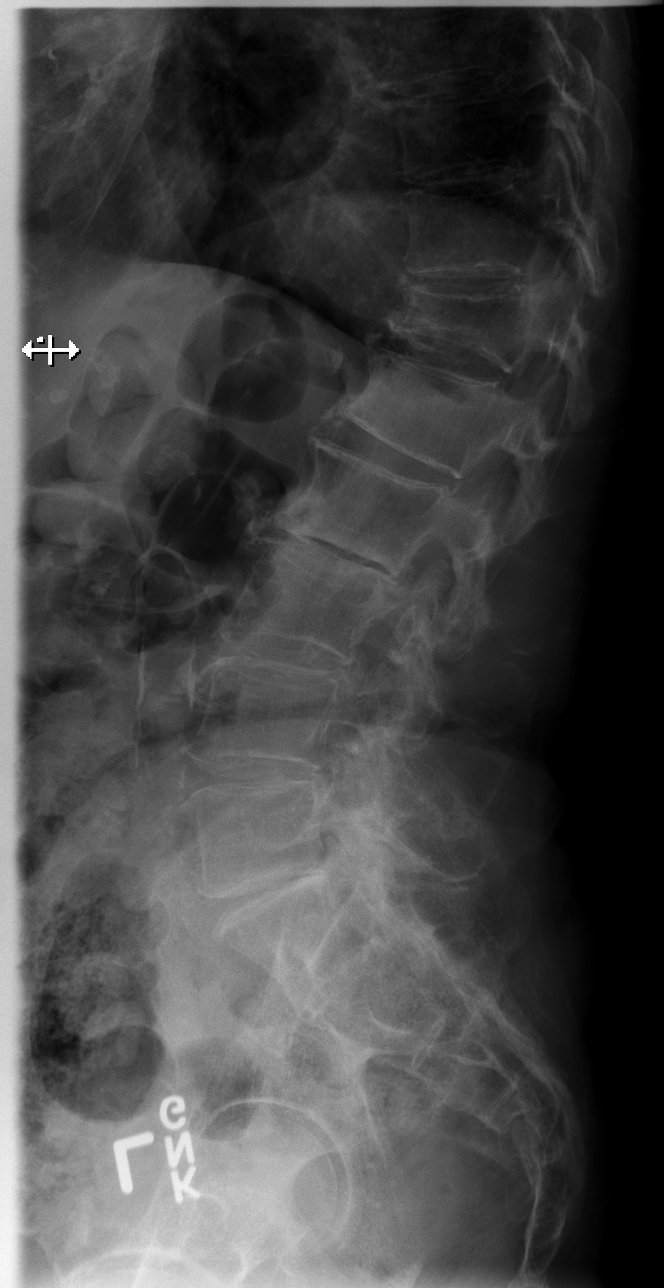

[t lumbar l-5 s-1 spot]
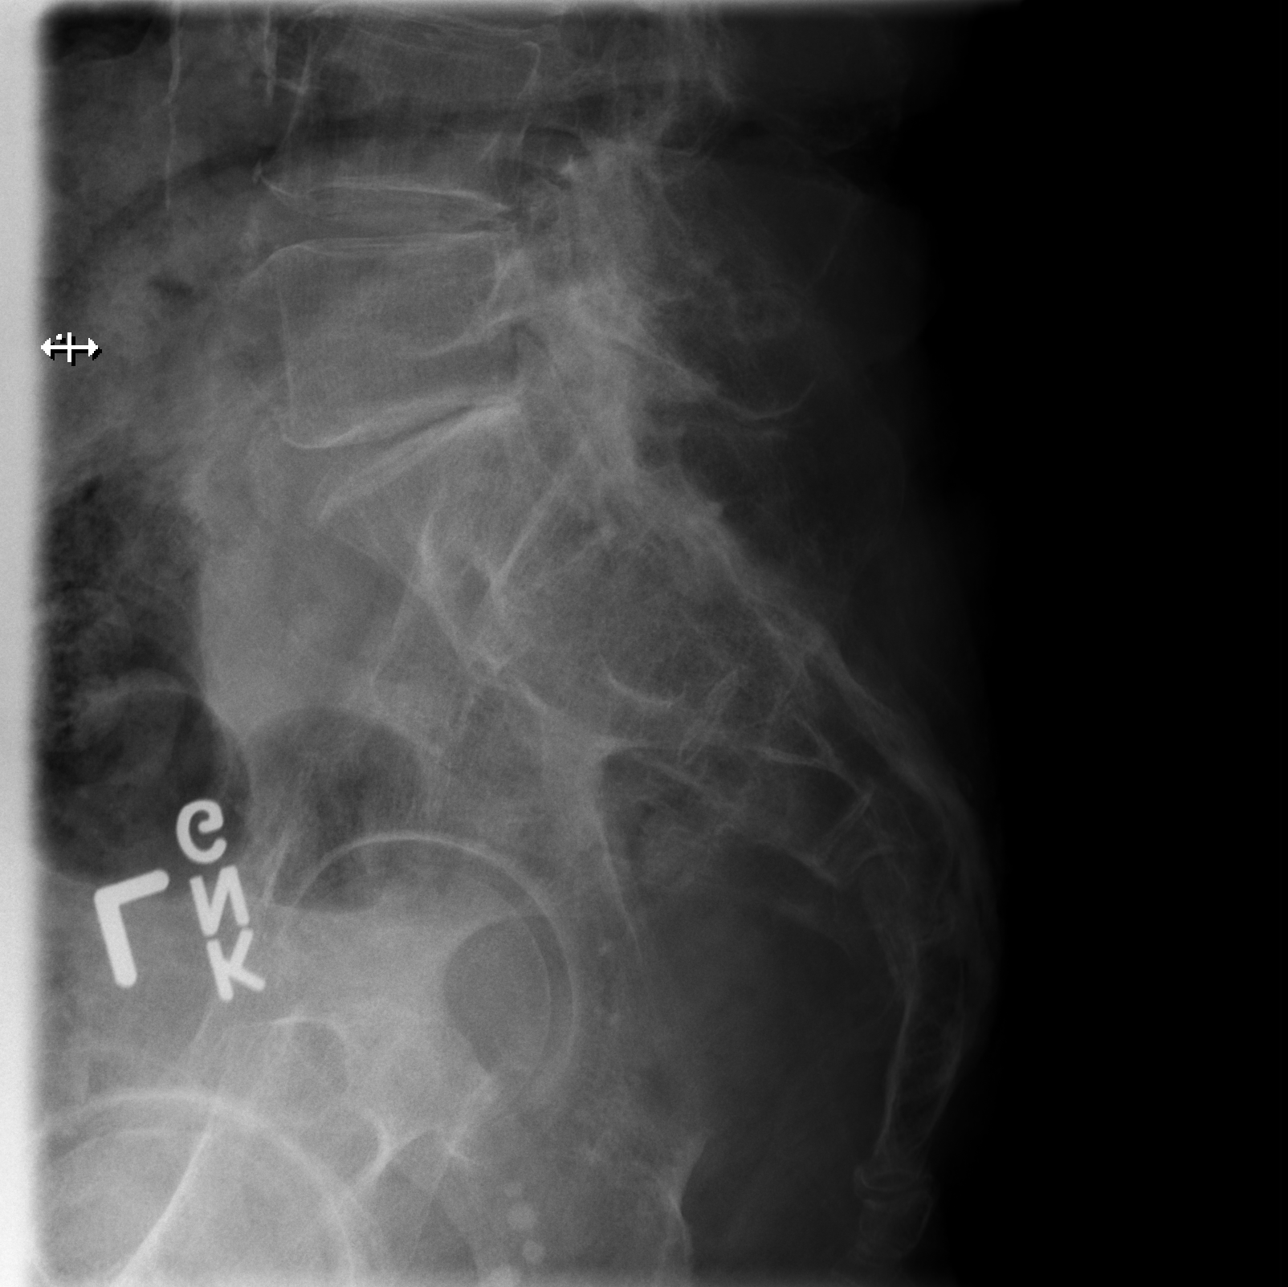

[5 of 5 positions shown; findings below may reference images not displayed]

FINDINGS: A moderate compression fracture of the T12 vertebral body is seen
which is of indeterminate age, but is new since prior study on
09/11/2014. No evidence of lumbar spine fracture. No evidence of
subluxation.

Moderate to severe degenerative disc disease is seen throughout the
lumbar spine, which is most severe at L2-3. Bilateral facet DJD is
also seen which is most pronounced at levels of L4-5 L5-S1.
Generalized osteopenia noted.
IMPRESSION: Moderate compression fracture of T12 vertebral body which is of
indeterminate age, but is new since prior exam on 09/11/2014.
Consider CT for further evaluation.

No evidence of acute lumbar spine fracture or subluxation.

Degenerative spondylosis, as described above.

Osteopenia.
